# Patient Record
Sex: Male | Born: 1935 | Race: White | Hispanic: No | Marital: Married | Smoking: Smoker, current status unknown
Health system: Southern US, Community
[De-identification: ages and names within clinical notes are randomized; demographics above are authoritative.]

## PROBLEM LIST (undated history)

## (undated) DIAGNOSIS — G3184 Mild cognitive impairment, so stated: Secondary | ICD-10-CM

## (undated) DIAGNOSIS — I639 Cerebral infarction, unspecified: Secondary | ICD-10-CM

## (undated) DIAGNOSIS — D500 Iron deficiency anemia secondary to blood loss (chronic): Secondary | ICD-10-CM

## (undated) DIAGNOSIS — K219 Gastro-esophageal reflux disease without esophagitis: Secondary | ICD-10-CM

## (undated) DIAGNOSIS — M1990 Unspecified osteoarthritis, unspecified site: Secondary | ICD-10-CM

## (undated) DIAGNOSIS — F419 Anxiety disorder, unspecified: Secondary | ICD-10-CM

## (undated) DIAGNOSIS — K5790 Diverticulosis of intestine, part unspecified, without perforation or abscess without bleeding: Secondary | ICD-10-CM

## (undated) DIAGNOSIS — J189 Pneumonia, unspecified organism: Secondary | ICD-10-CM

## (undated) DIAGNOSIS — C4490 Unspecified malignant neoplasm of skin, unspecified: Secondary | ICD-10-CM

## (undated) DIAGNOSIS — E785 Hyperlipidemia, unspecified: Secondary | ICD-10-CM

## (undated) DIAGNOSIS — G250 Essential tremor: Secondary | ICD-10-CM

## (undated) DIAGNOSIS — I38 Endocarditis, valve unspecified: Secondary | ICD-10-CM

## (undated) DIAGNOSIS — F32A Depression, unspecified: Secondary | ICD-10-CM

## (undated) DIAGNOSIS — F329 Major depressive disorder, single episode, unspecified: Secondary | ICD-10-CM

## (undated) DIAGNOSIS — I10 Essential (primary) hypertension: Secondary | ICD-10-CM

## (undated) DIAGNOSIS — G9341 Metabolic encephalopathy: Secondary | ICD-10-CM

## (undated) DIAGNOSIS — F319 Bipolar disorder, unspecified: Secondary | ICD-10-CM

## (undated) DIAGNOSIS — N400 Enlarged prostate without lower urinary tract symptoms: Secondary | ICD-10-CM

## (undated) DIAGNOSIS — K5731 Diverticulosis of large intestine without perforation or abscess with bleeding: Secondary | ICD-10-CM

## (undated) HISTORY — PX: OTHER SURGICAL HISTORY: SHX169

## (undated) HISTORY — PX: TRANSURETHRAL RESECTION OF PROSTATE: SHX73

## (undated) HISTORY — PX: COLONOSCOPY: SHX174

---

## 2013-04-09 DIAGNOSIS — J209 Acute bronchitis, unspecified: Secondary | ICD-10-CM | POA: Diagnosis not present

## 2013-05-03 DIAGNOSIS — H353190 Nonexudative age-related macular degeneration, unspecified eye, stage unspecified: Secondary | ICD-10-CM | POA: Diagnosis not present

## 2013-05-20 DIAGNOSIS — L570 Actinic keratosis: Secondary | ICD-10-CM | POA: Diagnosis not present

## 2013-05-27 DIAGNOSIS — J069 Acute upper respiratory infection, unspecified: Secondary | ICD-10-CM | POA: Diagnosis not present

## 2013-06-04 DIAGNOSIS — L570 Actinic keratosis: Secondary | ICD-10-CM | POA: Diagnosis not present

## 2013-08-16 DIAGNOSIS — N183 Chronic kidney disease, stage 3 unspecified: Secondary | ICD-10-CM | POA: Diagnosis not present

## 2013-08-16 DIAGNOSIS — Z79899 Other long term (current) drug therapy: Secondary | ICD-10-CM | POA: Diagnosis not present

## 2013-08-16 DIAGNOSIS — F339 Major depressive disorder, recurrent, unspecified: Secondary | ICD-10-CM | POA: Diagnosis not present

## 2013-08-16 DIAGNOSIS — G4700 Insomnia, unspecified: Secondary | ICD-10-CM | POA: Diagnosis not present

## 2013-08-16 DIAGNOSIS — I10 Essential (primary) hypertension: Secondary | ICD-10-CM | POA: Diagnosis not present

## 2013-08-16 DIAGNOSIS — E785 Hyperlipidemia, unspecified: Secondary | ICD-10-CM | POA: Diagnosis not present

## 2013-09-27 DIAGNOSIS — H6120 Impacted cerumen, unspecified ear: Secondary | ICD-10-CM | POA: Diagnosis not present

## 2013-10-07 DIAGNOSIS — G4700 Insomnia, unspecified: Secondary | ICD-10-CM | POA: Diagnosis not present

## 2013-10-07 DIAGNOSIS — M25559 Pain in unspecified hip: Secondary | ICD-10-CM | POA: Diagnosis not present

## 2013-10-07 DIAGNOSIS — M1990 Unspecified osteoarthritis, unspecified site: Secondary | ICD-10-CM | POA: Diagnosis not present

## 2013-10-07 DIAGNOSIS — J984 Other disorders of lung: Secondary | ICD-10-CM | POA: Diagnosis not present

## 2013-10-07 DIAGNOSIS — I10 Essential (primary) hypertension: Secondary | ICD-10-CM | POA: Diagnosis not present

## 2013-10-12 DIAGNOSIS — L570 Actinic keratosis: Secondary | ICD-10-CM | POA: Diagnosis not present

## 2013-10-21 DIAGNOSIS — J209 Acute bronchitis, unspecified: Secondary | ICD-10-CM | POA: Diagnosis not present

## 2013-10-21 DIAGNOSIS — R7309 Other abnormal glucose: Secondary | ICD-10-CM | POA: Diagnosis not present

## 2013-10-21 DIAGNOSIS — M1990 Unspecified osteoarthritis, unspecified site: Secondary | ICD-10-CM | POA: Diagnosis not present

## 2013-11-02 DIAGNOSIS — Z79899 Other long term (current) drug therapy: Secondary | ICD-10-CM | POA: Diagnosis not present

## 2013-11-02 DIAGNOSIS — J209 Acute bronchitis, unspecified: Secondary | ICD-10-CM | POA: Diagnosis not present

## 2013-11-02 DIAGNOSIS — N183 Chronic kidney disease, stage 3 unspecified: Secondary | ICD-10-CM | POA: Diagnosis not present

## 2013-11-02 DIAGNOSIS — I10 Essential (primary) hypertension: Secondary | ICD-10-CM | POA: Diagnosis not present

## 2013-11-11 DIAGNOSIS — I10 Essential (primary) hypertension: Secondary | ICD-10-CM | POA: Diagnosis not present

## 2013-11-11 DIAGNOSIS — J209 Acute bronchitis, unspecified: Secondary | ICD-10-CM | POA: Diagnosis not present

## 2013-11-11 DIAGNOSIS — N183 Chronic kidney disease, stage 3 unspecified: Secondary | ICD-10-CM | POA: Diagnosis not present

## 2013-11-18 DIAGNOSIS — R059 Cough, unspecified: Secondary | ICD-10-CM | POA: Diagnosis not present

## 2013-11-18 DIAGNOSIS — R05 Cough: Secondary | ICD-10-CM | POA: Diagnosis not present

## 2013-11-18 DIAGNOSIS — J984 Other disorders of lung: Secondary | ICD-10-CM | POA: Diagnosis not present

## 2013-11-18 DIAGNOSIS — N183 Chronic kidney disease, stage 3 unspecified: Secondary | ICD-10-CM | POA: Diagnosis not present

## 2013-11-18 DIAGNOSIS — R350 Frequency of micturition: Secondary | ICD-10-CM | POA: Diagnosis not present

## 2013-11-22 DIAGNOSIS — N183 Chronic kidney disease, stage 3 unspecified: Secondary | ICD-10-CM | POA: Diagnosis not present

## 2013-11-22 DIAGNOSIS — Z8709 Personal history of other diseases of the respiratory system: Secondary | ICD-10-CM | POA: Diagnosis not present

## 2013-11-22 DIAGNOSIS — I10 Essential (primary) hypertension: Secondary | ICD-10-CM | POA: Diagnosis not present

## 2013-11-22 DIAGNOSIS — Z79899 Other long term (current) drug therapy: Secondary | ICD-10-CM | POA: Diagnosis not present

## 2013-12-21 DIAGNOSIS — Z23 Encounter for immunization: Secondary | ICD-10-CM | POA: Diagnosis not present

## 2013-12-21 DIAGNOSIS — Z79899 Other long term (current) drug therapy: Secondary | ICD-10-CM | POA: Diagnosis not present

## 2013-12-21 DIAGNOSIS — N183 Chronic kidney disease, stage 3 unspecified: Secondary | ICD-10-CM | POA: Diagnosis not present

## 2014-03-08 DIAGNOSIS — S79912A Unspecified injury of left hip, initial encounter: Secondary | ICD-10-CM | POA: Diagnosis not present

## 2014-03-08 DIAGNOSIS — M1611 Unilateral primary osteoarthritis, right hip: Secondary | ICD-10-CM | POA: Diagnosis not present

## 2014-03-08 DIAGNOSIS — S72011A Unspecified intracapsular fracture of right femur, initial encounter for closed fracture: Secondary | ICD-10-CM | POA: Diagnosis not present

## 2014-03-08 DIAGNOSIS — G8918 Other acute postprocedural pain: Secondary | ICD-10-CM | POA: Diagnosis not present

## 2014-03-08 DIAGNOSIS — F0390 Unspecified dementia without behavioral disturbance: Secondary | ICD-10-CM | POA: Diagnosis not present

## 2014-03-08 DIAGNOSIS — Z85828 Personal history of other malignant neoplasm of skin: Secondary | ICD-10-CM | POA: Diagnosis not present

## 2014-03-08 DIAGNOSIS — Z96641 Presence of right artificial hip joint: Secondary | ICD-10-CM | POA: Diagnosis not present

## 2014-03-08 DIAGNOSIS — W19XXXA Unspecified fall, initial encounter: Secondary | ICD-10-CM | POA: Diagnosis not present

## 2014-03-08 DIAGNOSIS — M25551 Pain in right hip: Secondary | ICD-10-CM | POA: Diagnosis not present

## 2014-03-08 DIAGNOSIS — S299XXA Unspecified injury of thorax, initial encounter: Secondary | ICD-10-CM | POA: Diagnosis not present

## 2014-03-08 DIAGNOSIS — M21259 Flexion deformity, unspecified hip: Secondary | ICD-10-CM | POA: Diagnosis not present

## 2014-03-08 DIAGNOSIS — W19XXXD Unspecified fall, subsequent encounter: Secondary | ICD-10-CM | POA: Diagnosis not present

## 2014-03-08 DIAGNOSIS — M25559 Pain in unspecified hip: Secondary | ICD-10-CM | POA: Diagnosis not present

## 2014-03-08 DIAGNOSIS — R531 Weakness: Secondary | ICD-10-CM | POA: Diagnosis not present

## 2014-03-08 DIAGNOSIS — F419 Anxiety disorder, unspecified: Secondary | ICD-10-CM | POA: Diagnosis present

## 2014-03-08 DIAGNOSIS — S79921A Unspecified injury of right thigh, initial encounter: Secondary | ICD-10-CM | POA: Diagnosis not present

## 2014-03-08 DIAGNOSIS — F329 Major depressive disorder, single episode, unspecified: Secondary | ICD-10-CM | POA: Diagnosis present

## 2014-03-08 DIAGNOSIS — I10 Essential (primary) hypertension: Secondary | ICD-10-CM | POA: Diagnosis not present

## 2014-03-08 DIAGNOSIS — S72001A Fracture of unspecified part of neck of right femur, initial encounter for closed fracture: Secondary | ICD-10-CM | POA: Diagnosis not present

## 2014-03-08 DIAGNOSIS — Z471 Aftercare following joint replacement surgery: Secondary | ICD-10-CM | POA: Diagnosis not present

## 2014-03-08 DIAGNOSIS — E860 Dehydration: Secondary | ICD-10-CM | POA: Diagnosis not present

## 2014-03-08 DIAGNOSIS — T699XXA Effect of reduced temperature, unspecified, initial encounter: Secondary | ICD-10-CM | POA: Diagnosis not present

## 2014-03-08 DIAGNOSIS — S72011D Unspecified intracapsular fracture of right femur, subsequent encounter for closed fracture with routine healing: Secondary | ICD-10-CM | POA: Diagnosis not present

## 2014-03-08 DIAGNOSIS — M79651 Pain in right thigh: Secondary | ICD-10-CM | POA: Diagnosis not present

## 2014-03-08 DIAGNOSIS — E785 Hyperlipidemia, unspecified: Secondary | ICD-10-CM | POA: Diagnosis not present

## 2014-03-09 DIAGNOSIS — Z96641 Presence of right artificial hip joint: Secondary | ICD-10-CM | POA: Diagnosis not present

## 2014-03-09 DIAGNOSIS — W19XXXA Unspecified fall, initial encounter: Secondary | ICD-10-CM | POA: Diagnosis not present

## 2014-03-09 DIAGNOSIS — E860 Dehydration: Secondary | ICD-10-CM | POA: Diagnosis not present

## 2014-03-09 DIAGNOSIS — E785 Hyperlipidemia, unspecified: Secondary | ICD-10-CM | POA: Diagnosis not present

## 2014-03-09 DIAGNOSIS — S72011A Unspecified intracapsular fracture of right femur, initial encounter for closed fracture: Secondary | ICD-10-CM | POA: Diagnosis not present

## 2014-03-09 DIAGNOSIS — Z471 Aftercare following joint replacement surgery: Secondary | ICD-10-CM | POA: Diagnosis not present

## 2014-03-09 DIAGNOSIS — M1611 Unilateral primary osteoarthritis, right hip: Secondary | ICD-10-CM | POA: Diagnosis not present

## 2014-03-09 DIAGNOSIS — F0390 Unspecified dementia without behavioral disturbance: Secondary | ICD-10-CM | POA: Diagnosis not present

## 2014-03-10 DIAGNOSIS — W19XXXA Unspecified fall, initial encounter: Secondary | ICD-10-CM | POA: Diagnosis not present

## 2014-03-10 DIAGNOSIS — E860 Dehydration: Secondary | ICD-10-CM | POA: Diagnosis not present

## 2014-03-10 DIAGNOSIS — S72011A Unspecified intracapsular fracture of right femur, initial encounter for closed fracture: Secondary | ICD-10-CM | POA: Diagnosis not present

## 2014-03-10 DIAGNOSIS — E785 Hyperlipidemia, unspecified: Secondary | ICD-10-CM | POA: Diagnosis not present

## 2014-03-11 DIAGNOSIS — M21259 Flexion deformity, unspecified hip: Secondary | ICD-10-CM | POA: Diagnosis not present

## 2014-03-11 DIAGNOSIS — F310 Bipolar disorder, current episode hypomanic: Secondary | ICD-10-CM | POA: Diagnosis not present

## 2014-03-11 DIAGNOSIS — E785 Hyperlipidemia, unspecified: Secondary | ICD-10-CM | POA: Diagnosis not present

## 2014-03-11 DIAGNOSIS — S72011A Unspecified intracapsular fracture of right femur, initial encounter for closed fracture: Secondary | ICD-10-CM | POA: Diagnosis not present

## 2014-03-11 DIAGNOSIS — Z96649 Presence of unspecified artificial hip joint: Secondary | ICD-10-CM | POA: Diagnosis not present

## 2014-03-11 DIAGNOSIS — R5381 Other malaise: Secondary | ICD-10-CM | POA: Diagnosis not present

## 2014-03-11 DIAGNOSIS — E860 Dehydration: Secondary | ICD-10-CM | POA: Diagnosis not present

## 2014-03-11 DIAGNOSIS — W19XXXA Unspecified fall, initial encounter: Secondary | ICD-10-CM | POA: Diagnosis not present

## 2014-03-11 DIAGNOSIS — R339 Retention of urine, unspecified: Secondary | ICD-10-CM | POA: Diagnosis not present

## 2014-03-11 DIAGNOSIS — W19XXXD Unspecified fall, subsequent encounter: Secondary | ICD-10-CM | POA: Diagnosis not present

## 2014-03-11 DIAGNOSIS — M25559 Pain in unspecified hip: Secondary | ICD-10-CM | POA: Diagnosis not present

## 2014-03-11 DIAGNOSIS — F0390 Unspecified dementia without behavioral disturbance: Secondary | ICD-10-CM | POA: Diagnosis not present

## 2014-03-11 DIAGNOSIS — D649 Anemia, unspecified: Secondary | ICD-10-CM | POA: Diagnosis not present

## 2014-03-11 DIAGNOSIS — S72011D Unspecified intracapsular fracture of right femur, subsequent encounter for closed fracture with routine healing: Secondary | ICD-10-CM | POA: Diagnosis not present

## 2014-03-11 DIAGNOSIS — G8918 Other acute postprocedural pain: Secondary | ICD-10-CM | POA: Diagnosis not present

## 2014-03-11 DIAGNOSIS — I119 Hypertensive heart disease without heart failure: Secondary | ICD-10-CM | POA: Diagnosis not present

## 2014-03-11 DIAGNOSIS — J159 Unspecified bacterial pneumonia: Secondary | ICD-10-CM | POA: Diagnosis not present

## 2014-03-14 DIAGNOSIS — S72011D Unspecified intracapsular fracture of right femur, subsequent encounter for closed fracture with routine healing: Secondary | ICD-10-CM | POA: Diagnosis not present

## 2014-03-14 DIAGNOSIS — J159 Unspecified bacterial pneumonia: Secondary | ICD-10-CM | POA: Diagnosis not present

## 2014-03-14 DIAGNOSIS — D649 Anemia, unspecified: Secondary | ICD-10-CM | POA: Diagnosis not present

## 2014-03-14 DIAGNOSIS — I119 Hypertensive heart disease without heart failure: Secondary | ICD-10-CM | POA: Diagnosis not present

## 2014-03-14 DIAGNOSIS — F310 Bipolar disorder, current episode hypomanic: Secondary | ICD-10-CM | POA: Diagnosis not present

## 2014-03-14 DIAGNOSIS — R5381 Other malaise: Secondary | ICD-10-CM | POA: Diagnosis not present

## 2014-03-14 DIAGNOSIS — G8918 Other acute postprocedural pain: Secondary | ICD-10-CM | POA: Diagnosis not present

## 2014-03-14 DIAGNOSIS — R339 Retention of urine, unspecified: Secondary | ICD-10-CM | POA: Diagnosis not present

## 2014-03-23 DIAGNOSIS — Z96649 Presence of unspecified artificial hip joint: Secondary | ICD-10-CM | POA: Diagnosis not present

## 2014-03-31 DIAGNOSIS — R0602 Shortness of breath: Secondary | ICD-10-CM | POA: Diagnosis not present

## 2014-03-31 DIAGNOSIS — Z7982 Long term (current) use of aspirin: Secondary | ICD-10-CM | POA: Diagnosis not present

## 2014-03-31 DIAGNOSIS — I10 Essential (primary) hypertension: Secondary | ICD-10-CM | POA: Diagnosis not present

## 2014-03-31 DIAGNOSIS — R51 Headache: Secondary | ICD-10-CM | POA: Diagnosis not present

## 2014-03-31 DIAGNOSIS — R918 Other nonspecific abnormal finding of lung field: Secondary | ICD-10-CM | POA: Diagnosis not present

## 2014-03-31 DIAGNOSIS — R55 Syncope and collapse: Secondary | ICD-10-CM | POA: Diagnosis not present

## 2014-03-31 DIAGNOSIS — W1839XA Other fall on same level, initial encounter: Secondary | ICD-10-CM | POA: Diagnosis not present

## 2014-03-31 DIAGNOSIS — R531 Weakness: Secondary | ICD-10-CM | POA: Diagnosis not present

## 2014-03-31 DIAGNOSIS — Z79899 Other long term (current) drug therapy: Secondary | ICD-10-CM | POA: Diagnosis not present

## 2014-03-31 DIAGNOSIS — M7989 Other specified soft tissue disorders: Secondary | ICD-10-CM | POA: Diagnosis not present

## 2014-03-31 DIAGNOSIS — R40242 Glasgow coma scale score 9-12: Secondary | ICD-10-CM | POA: Diagnosis not present

## 2014-03-31 DIAGNOSIS — R05 Cough: Secondary | ICD-10-CM | POA: Diagnosis not present

## 2014-03-31 DIAGNOSIS — R4182 Altered mental status, unspecified: Secondary | ICD-10-CM | POA: Diagnosis not present

## 2014-03-31 DIAGNOSIS — R9431 Abnormal electrocardiogram [ECG] [EKG]: Secondary | ICD-10-CM | POA: Diagnosis not present

## 2014-03-31 DIAGNOSIS — E780 Pure hypercholesterolemia: Secondary | ICD-10-CM | POA: Diagnosis not present

## 2014-03-31 DIAGNOSIS — R102 Pelvic and perineal pain: Secondary | ICD-10-CM | POA: Diagnosis not present

## 2014-03-31 DIAGNOSIS — G252 Other specified forms of tremor: Secondary | ICD-10-CM | POA: Diagnosis not present

## 2014-03-31 DIAGNOSIS — F0390 Unspecified dementia without behavioral disturbance: Secondary | ICD-10-CM | POA: Diagnosis not present

## 2014-03-31 DIAGNOSIS — M542 Cervicalgia: Secondary | ICD-10-CM | POA: Diagnosis not present

## 2014-03-31 DIAGNOSIS — J189 Pneumonia, unspecified organism: Secondary | ICD-10-CM | POA: Diagnosis not present

## 2014-03-31 DIAGNOSIS — R410 Disorientation, unspecified: Secondary | ICD-10-CM | POA: Diagnosis not present

## 2014-04-01 DIAGNOSIS — Z743 Need for continuous supervision: Secondary | ICD-10-CM | POA: Diagnosis not present

## 2014-04-01 DIAGNOSIS — M25551 Pain in right hip: Secondary | ICD-10-CM | POA: Diagnosis not present

## 2014-04-01 DIAGNOSIS — I10 Essential (primary) hypertension: Secondary | ICD-10-CM | POA: Diagnosis not present

## 2014-04-01 DIAGNOSIS — R51 Headache: Secondary | ICD-10-CM | POA: Diagnosis not present

## 2014-04-01 DIAGNOSIS — R279 Unspecified lack of coordination: Secondary | ICD-10-CM | POA: Diagnosis not present

## 2014-04-01 DIAGNOSIS — R05 Cough: Secondary | ICD-10-CM | POA: Diagnosis not present

## 2014-04-01 DIAGNOSIS — R4182 Altered mental status, unspecified: Secondary | ICD-10-CM | POA: Diagnosis not present

## 2014-04-01 DIAGNOSIS — M542 Cervicalgia: Secondary | ICD-10-CM | POA: Diagnosis not present

## 2014-04-01 DIAGNOSIS — R918 Other nonspecific abnormal finding of lung field: Secondary | ICD-10-CM | POA: Diagnosis not present

## 2014-04-01 DIAGNOSIS — R410 Disorientation, unspecified: Secondary | ICD-10-CM | POA: Diagnosis not present

## 2014-04-01 DIAGNOSIS — R0602 Shortness of breath: Secondary | ICD-10-CM | POA: Diagnosis not present

## 2014-04-01 DIAGNOSIS — R102 Pelvic and perineal pain: Secondary | ICD-10-CM | POA: Diagnosis not present

## 2014-04-01 DIAGNOSIS — J189 Pneumonia, unspecified organism: Secondary | ICD-10-CM | POA: Diagnosis not present

## 2014-04-02 DIAGNOSIS — R531 Weakness: Secondary | ICD-10-CM | POA: Diagnosis not present

## 2014-04-02 DIAGNOSIS — R9431 Abnormal electrocardiogram [ECG] [EKG]: Secondary | ICD-10-CM | POA: Diagnosis not present

## 2014-04-02 DIAGNOSIS — Z8701 Personal history of pneumonia (recurrent): Secondary | ICD-10-CM | POA: Diagnosis not present

## 2014-04-02 DIAGNOSIS — R4182 Altered mental status, unspecified: Secondary | ICD-10-CM | POA: Diagnosis not present

## 2014-04-02 DIAGNOSIS — R404 Transient alteration of awareness: Secondary | ICD-10-CM | POA: Diagnosis not present

## 2014-04-02 DIAGNOSIS — R55 Syncope and collapse: Secondary | ICD-10-CM | POA: Diagnosis not present

## 2014-04-02 DIAGNOSIS — E780 Pure hypercholesterolemia: Secondary | ICD-10-CM | POA: Diagnosis not present

## 2014-04-02 DIAGNOSIS — W1839XA Other fall on same level, initial encounter: Secondary | ICD-10-CM | POA: Diagnosis not present

## 2014-04-02 DIAGNOSIS — J189 Pneumonia, unspecified organism: Secondary | ICD-10-CM | POA: Diagnosis not present

## 2014-04-02 DIAGNOSIS — F0390 Unspecified dementia without behavioral disturbance: Secondary | ICD-10-CM | POA: Diagnosis not present

## 2014-04-02 DIAGNOSIS — I10 Essential (primary) hypertension: Secondary | ICD-10-CM | POA: Diagnosis not present

## 2014-04-02 DIAGNOSIS — M7989 Other specified soft tissue disorders: Secondary | ICD-10-CM | POA: Diagnosis not present

## 2014-04-04 DIAGNOSIS — W1839XA Other fall on same level, initial encounter: Secondary | ICD-10-CM | POA: Diagnosis not present

## 2014-04-04 DIAGNOSIS — S0081XA Abrasion of other part of head, initial encounter: Secondary | ICD-10-CM | POA: Diagnosis not present

## 2014-04-04 DIAGNOSIS — Y92129 Unspecified place in nursing home as the place of occurrence of the external cause: Secondary | ICD-10-CM | POA: Diagnosis not present

## 2014-04-04 DIAGNOSIS — R4182 Altered mental status, unspecified: Secondary | ICD-10-CM | POA: Diagnosis not present

## 2014-04-04 DIAGNOSIS — I10 Essential (primary) hypertension: Secondary | ICD-10-CM | POA: Diagnosis not present

## 2014-04-04 DIAGNOSIS — S0990XA Unspecified injury of head, initial encounter: Secondary | ICD-10-CM | POA: Diagnosis not present

## 2014-04-04 DIAGNOSIS — R259 Unspecified abnormal involuntary movements: Secondary | ICD-10-CM | POA: Diagnosis not present

## 2014-04-04 DIAGNOSIS — I6789 Other cerebrovascular disease: Secondary | ICD-10-CM | POA: Diagnosis not present

## 2014-04-04 DIAGNOSIS — R404 Transient alteration of awareness: Secondary | ICD-10-CM | POA: Diagnosis not present

## 2014-04-06 DIAGNOSIS — F310 Bipolar disorder, current episode hypomanic: Secondary | ICD-10-CM | POA: Diagnosis not present

## 2014-04-06 DIAGNOSIS — R0602 Shortness of breath: Secondary | ICD-10-CM | POA: Diagnosis not present

## 2014-04-06 DIAGNOSIS — J159 Unspecified bacterial pneumonia: Secondary | ICD-10-CM | POA: Diagnosis not present

## 2014-04-06 DIAGNOSIS — F0390 Unspecified dementia without behavioral disturbance: Secondary | ICD-10-CM | POA: Diagnosis not present

## 2014-04-06 DIAGNOSIS — R339 Retention of urine, unspecified: Secondary | ICD-10-CM | POA: Diagnosis present

## 2014-04-06 DIAGNOSIS — R609 Edema, unspecified: Secondary | ICD-10-CM | POA: Diagnosis not present

## 2014-04-06 DIAGNOSIS — R410 Disorientation, unspecified: Secondary | ICD-10-CM | POA: Diagnosis not present

## 2014-04-06 DIAGNOSIS — S9032XA Contusion of left foot, initial encounter: Secondary | ICD-10-CM | POA: Diagnosis present

## 2014-04-06 DIAGNOSIS — F319 Bipolar disorder, unspecified: Secondary | ICD-10-CM | POA: Diagnosis present

## 2014-04-06 DIAGNOSIS — E860 Dehydration: Secondary | ICD-10-CM | POA: Diagnosis not present

## 2014-04-06 DIAGNOSIS — J90 Pleural effusion, not elsewhere classified: Secondary | ICD-10-CM | POA: Diagnosis not present

## 2014-04-06 DIAGNOSIS — I10 Essential (primary) hypertension: Secondary | ICD-10-CM | POA: Diagnosis not present

## 2014-04-06 DIAGNOSIS — K219 Gastro-esophageal reflux disease without esophagitis: Secondary | ICD-10-CM | POA: Diagnosis present

## 2014-04-06 DIAGNOSIS — J189 Pneumonia, unspecified organism: Secondary | ICD-10-CM | POA: Diagnosis not present

## 2014-04-06 DIAGNOSIS — D649 Anemia, unspecified: Secondary | ICD-10-CM | POA: Diagnosis not present

## 2014-04-06 DIAGNOSIS — R5381 Other malaise: Secondary | ICD-10-CM | POA: Diagnosis present

## 2014-04-06 DIAGNOSIS — J9690 Respiratory failure, unspecified, unspecified whether with hypoxia or hypercapnia: Secondary | ICD-10-CM | POA: Diagnosis not present

## 2014-04-06 DIAGNOSIS — S9032XD Contusion of left foot, subsequent encounter: Secondary | ICD-10-CM | POA: Diagnosis not present

## 2014-04-06 DIAGNOSIS — G92 Toxic encephalopathy: Secondary | ICD-10-CM | POA: Diagnosis not present

## 2014-04-06 DIAGNOSIS — E785 Hyperlipidemia, unspecified: Secondary | ICD-10-CM | POA: Diagnosis not present

## 2014-04-12 DIAGNOSIS — R339 Retention of urine, unspecified: Secondary | ICD-10-CM | POA: Diagnosis not present

## 2014-04-12 DIAGNOSIS — S9032XA Contusion of left foot, initial encounter: Secondary | ICD-10-CM | POA: Diagnosis not present

## 2014-04-12 DIAGNOSIS — G8918 Other acute postprocedural pain: Secondary | ICD-10-CM | POA: Diagnosis not present

## 2014-04-12 DIAGNOSIS — I119 Hypertensive heart disease without heart failure: Secondary | ICD-10-CM | POA: Diagnosis not present

## 2014-04-12 DIAGNOSIS — R5381 Other malaise: Secondary | ICD-10-CM | POA: Diagnosis not present

## 2014-04-12 DIAGNOSIS — E785 Hyperlipidemia, unspecified: Secondary | ICD-10-CM | POA: Diagnosis not present

## 2014-04-12 DIAGNOSIS — S72011D Unspecified intracapsular fracture of right femur, subsequent encounter for closed fracture with routine healing: Secondary | ICD-10-CM | POA: Diagnosis not present

## 2014-04-12 DIAGNOSIS — D649 Anemia, unspecified: Secondary | ICD-10-CM | POA: Diagnosis not present

## 2014-04-12 DIAGNOSIS — J159 Unspecified bacterial pneumonia: Secondary | ICD-10-CM | POA: Diagnosis not present

## 2014-04-12 DIAGNOSIS — S9032XD Contusion of left foot, subsequent encounter: Secondary | ICD-10-CM | POA: Diagnosis not present

## 2014-04-12 DIAGNOSIS — F310 Bipolar disorder, current episode hypomanic: Secondary | ICD-10-CM | POA: Diagnosis not present

## 2014-04-12 DIAGNOSIS — G92 Toxic encephalopathy: Secondary | ICD-10-CM | POA: Diagnosis not present

## 2014-04-14 DIAGNOSIS — J159 Unspecified bacterial pneumonia: Secondary | ICD-10-CM | POA: Diagnosis not present

## 2014-04-14 DIAGNOSIS — S72011D Unspecified intracapsular fracture of right femur, subsequent encounter for closed fracture with routine healing: Secondary | ICD-10-CM | POA: Diagnosis not present

## 2014-04-14 DIAGNOSIS — G8918 Other acute postprocedural pain: Secondary | ICD-10-CM | POA: Diagnosis not present

## 2014-04-14 DIAGNOSIS — I119 Hypertensive heart disease without heart failure: Secondary | ICD-10-CM | POA: Diagnosis not present

## 2014-04-14 DIAGNOSIS — D649 Anemia, unspecified: Secondary | ICD-10-CM | POA: Diagnosis not present

## 2014-04-14 DIAGNOSIS — R5381 Other malaise: Secondary | ICD-10-CM | POA: Diagnosis not present

## 2014-04-14 DIAGNOSIS — R339 Retention of urine, unspecified: Secondary | ICD-10-CM | POA: Diagnosis not present

## 2014-04-14 DIAGNOSIS — F310 Bipolar disorder, current episode hypomanic: Secondary | ICD-10-CM | POA: Diagnosis not present

## 2014-04-26 DIAGNOSIS — M6281 Muscle weakness (generalized): Secondary | ICD-10-CM | POA: Diagnosis not present

## 2014-04-26 DIAGNOSIS — Z471 Aftercare following joint replacement surgery: Secondary | ICD-10-CM | POA: Diagnosis not present

## 2014-04-26 DIAGNOSIS — R2689 Other abnormalities of gait and mobility: Secondary | ICD-10-CM | POA: Diagnosis not present

## 2014-04-26 DIAGNOSIS — Z96641 Presence of right artificial hip joint: Secondary | ICD-10-CM | POA: Diagnosis not present

## 2014-04-27 DIAGNOSIS — I10 Essential (primary) hypertension: Secondary | ICD-10-CM | POA: Diagnosis not present

## 2014-04-27 DIAGNOSIS — F319 Bipolar disorder, unspecified: Secondary | ICD-10-CM | POA: Diagnosis not present

## 2014-04-27 DIAGNOSIS — N400 Enlarged prostate without lower urinary tract symptoms: Secondary | ICD-10-CM | POA: Diagnosis not present

## 2014-04-27 DIAGNOSIS — J189 Pneumonia, unspecified organism: Secondary | ICD-10-CM | POA: Diagnosis not present

## 2014-04-27 DIAGNOSIS — N183 Chronic kidney disease, stage 3 (moderate): Secondary | ICD-10-CM | POA: Diagnosis not present

## 2014-04-27 DIAGNOSIS — R410 Disorientation, unspecified: Secondary | ICD-10-CM | POA: Diagnosis not present

## 2014-04-28 DIAGNOSIS — Z471 Aftercare following joint replacement surgery: Secondary | ICD-10-CM | POA: Diagnosis not present

## 2014-04-28 DIAGNOSIS — M6281 Muscle weakness (generalized): Secondary | ICD-10-CM | POA: Diagnosis not present

## 2014-04-28 DIAGNOSIS — Z96641 Presence of right artificial hip joint: Secondary | ICD-10-CM | POA: Diagnosis not present

## 2014-04-28 DIAGNOSIS — R2689 Other abnormalities of gait and mobility: Secondary | ICD-10-CM | POA: Diagnosis not present

## 2014-05-02 DIAGNOSIS — M6281 Muscle weakness (generalized): Secondary | ICD-10-CM | POA: Diagnosis not present

## 2014-05-02 DIAGNOSIS — Z471 Aftercare following joint replacement surgery: Secondary | ICD-10-CM | POA: Diagnosis not present

## 2014-05-02 DIAGNOSIS — R2689 Other abnormalities of gait and mobility: Secondary | ICD-10-CM | POA: Diagnosis not present

## 2014-05-02 DIAGNOSIS — Z96641 Presence of right artificial hip joint: Secondary | ICD-10-CM | POA: Diagnosis not present

## 2014-05-04 DIAGNOSIS — Z96649 Presence of unspecified artificial hip joint: Secondary | ICD-10-CM | POA: Diagnosis not present

## 2014-05-05 DIAGNOSIS — Z96641 Presence of right artificial hip joint: Secondary | ICD-10-CM | POA: Diagnosis not present

## 2014-05-05 DIAGNOSIS — R2689 Other abnormalities of gait and mobility: Secondary | ICD-10-CM | POA: Diagnosis not present

## 2014-05-05 DIAGNOSIS — M6281 Muscle weakness (generalized): Secondary | ICD-10-CM | POA: Diagnosis not present

## 2014-05-05 DIAGNOSIS — Z471 Aftercare following joint replacement surgery: Secondary | ICD-10-CM | POA: Diagnosis not present

## 2014-05-09 DIAGNOSIS — M6281 Muscle weakness (generalized): Secondary | ICD-10-CM | POA: Diagnosis not present

## 2014-05-09 DIAGNOSIS — Z471 Aftercare following joint replacement surgery: Secondary | ICD-10-CM | POA: Diagnosis not present

## 2014-05-09 DIAGNOSIS — R2689 Other abnormalities of gait and mobility: Secondary | ICD-10-CM | POA: Diagnosis not present

## 2014-05-09 DIAGNOSIS — Z96641 Presence of right artificial hip joint: Secondary | ICD-10-CM | POA: Diagnosis not present

## 2014-05-10 DIAGNOSIS — R001 Bradycardia, unspecified: Secondary | ICD-10-CM | POA: Diagnosis not present

## 2014-05-10 DIAGNOSIS — R251 Tremor, unspecified: Secondary | ICD-10-CM | POA: Diagnosis not present

## 2014-05-10 DIAGNOSIS — Z79899 Other long term (current) drug therapy: Secondary | ICD-10-CM | POA: Diagnosis not present

## 2014-05-10 DIAGNOSIS — I10 Essential (primary) hypertension: Secondary | ICD-10-CM | POA: Diagnosis not present

## 2014-05-10 DIAGNOSIS — N390 Urinary tract infection, site not specified: Secondary | ICD-10-CM | POA: Diagnosis not present

## 2014-05-10 DIAGNOSIS — R350 Frequency of micturition: Secondary | ICD-10-CM | POA: Diagnosis not present

## 2014-05-12 DIAGNOSIS — M6281 Muscle weakness (generalized): Secondary | ICD-10-CM | POA: Diagnosis not present

## 2014-05-12 DIAGNOSIS — Z471 Aftercare following joint replacement surgery: Secondary | ICD-10-CM | POA: Diagnosis not present

## 2014-05-12 DIAGNOSIS — Z96641 Presence of right artificial hip joint: Secondary | ICD-10-CM | POA: Diagnosis not present

## 2014-05-12 DIAGNOSIS — R2689 Other abnormalities of gait and mobility: Secondary | ICD-10-CM | POA: Diagnosis not present

## 2014-05-16 DIAGNOSIS — R2689 Other abnormalities of gait and mobility: Secondary | ICD-10-CM | POA: Diagnosis not present

## 2014-05-16 DIAGNOSIS — Z471 Aftercare following joint replacement surgery: Secondary | ICD-10-CM | POA: Diagnosis not present

## 2014-05-16 DIAGNOSIS — M6281 Muscle weakness (generalized): Secondary | ICD-10-CM | POA: Diagnosis not present

## 2014-05-16 DIAGNOSIS — Z96641 Presence of right artificial hip joint: Secondary | ICD-10-CM | POA: Diagnosis not present

## 2014-05-18 DIAGNOSIS — M6281 Muscle weakness (generalized): Secondary | ICD-10-CM | POA: Diagnosis not present

## 2014-05-18 DIAGNOSIS — Z96641 Presence of right artificial hip joint: Secondary | ICD-10-CM | POA: Diagnosis not present

## 2014-05-18 DIAGNOSIS — Z471 Aftercare following joint replacement surgery: Secondary | ICD-10-CM | POA: Diagnosis not present

## 2014-05-18 DIAGNOSIS — R2689 Other abnormalities of gait and mobility: Secondary | ICD-10-CM | POA: Diagnosis not present

## 2014-05-19 DIAGNOSIS — S2232XA Fracture of one rib, left side, initial encounter for closed fracture: Secondary | ICD-10-CM | POA: Diagnosis not present

## 2014-05-19 DIAGNOSIS — K5641 Fecal impaction: Secondary | ICD-10-CM | POA: Diagnosis not present

## 2014-05-19 DIAGNOSIS — E780 Pure hypercholesterolemia: Secondary | ICD-10-CM | POA: Diagnosis not present

## 2014-05-19 DIAGNOSIS — R51 Headache: Secondary | ICD-10-CM | POA: Diagnosis not present

## 2014-05-19 DIAGNOSIS — Z87891 Personal history of nicotine dependence: Secondary | ICD-10-CM | POA: Diagnosis not present

## 2014-05-19 DIAGNOSIS — I2510 Atherosclerotic heart disease of native coronary artery without angina pectoris: Secondary | ICD-10-CM | POA: Diagnosis not present

## 2014-05-19 DIAGNOSIS — R55 Syncope and collapse: Secondary | ICD-10-CM | POA: Diagnosis not present

## 2014-05-19 DIAGNOSIS — M1990 Unspecified osteoarthritis, unspecified site: Secondary | ICD-10-CM | POA: Diagnosis not present

## 2014-05-19 DIAGNOSIS — S2239XA Fracture of one rib, unspecified side, initial encounter for closed fracture: Secondary | ICD-10-CM | POA: Diagnosis not present

## 2014-05-19 DIAGNOSIS — S4982XA Other specified injuries of left shoulder and upper arm, initial encounter: Secondary | ICD-10-CM | POA: Diagnosis not present

## 2014-05-19 DIAGNOSIS — I10 Essential (primary) hypertension: Secondary | ICD-10-CM | POA: Diagnosis not present

## 2014-05-19 DIAGNOSIS — S42342A Displaced spiral fracture of shaft of humerus, left arm, initial encounter for closed fracture: Secondary | ICD-10-CM | POA: Diagnosis not present

## 2014-05-19 DIAGNOSIS — R079 Chest pain, unspecified: Secondary | ICD-10-CM | POA: Diagnosis not present

## 2014-05-19 DIAGNOSIS — M79652 Pain in left thigh: Secondary | ICD-10-CM | POA: Diagnosis not present

## 2014-05-19 DIAGNOSIS — R52 Pain, unspecified: Secondary | ICD-10-CM | POA: Diagnosis not present

## 2014-05-19 DIAGNOSIS — Z7982 Long term (current) use of aspirin: Secondary | ICD-10-CM | POA: Diagnosis not present

## 2014-05-19 DIAGNOSIS — F0390 Unspecified dementia without behavioral disturbance: Secondary | ICD-10-CM | POA: Diagnosis not present

## 2014-05-19 DIAGNOSIS — N4289 Other specified disorders of prostate: Secondary | ICD-10-CM | POA: Diagnosis not present

## 2014-05-19 DIAGNOSIS — M25512 Pain in left shoulder: Secondary | ICD-10-CM | POA: Diagnosis not present

## 2014-05-19 DIAGNOSIS — S199XXA Unspecified injury of neck, initial encounter: Secondary | ICD-10-CM | POA: Diagnosis not present

## 2014-05-19 DIAGNOSIS — S0990XA Unspecified injury of head, initial encounter: Secondary | ICD-10-CM | POA: Diagnosis not present

## 2014-05-19 DIAGNOSIS — S79912A Unspecified injury of left hip, initial encounter: Secondary | ICD-10-CM | POA: Diagnosis not present

## 2014-05-19 DIAGNOSIS — R5381 Other malaise: Secondary | ICD-10-CM | POA: Diagnosis not present

## 2014-05-19 DIAGNOSIS — M810 Age-related osteoporosis without current pathological fracture: Secondary | ICD-10-CM | POA: Diagnosis not present

## 2014-05-19 DIAGNOSIS — Z792 Long term (current) use of antibiotics: Secondary | ICD-10-CM | POA: Diagnosis not present

## 2014-05-19 DIAGNOSIS — K5900 Constipation, unspecified: Secondary | ICD-10-CM | POA: Diagnosis not present

## 2014-05-19 DIAGNOSIS — S79911A Unspecified injury of right hip, initial encounter: Secondary | ICD-10-CM | POA: Diagnosis not present

## 2014-05-19 DIAGNOSIS — S42202A Unspecified fracture of upper end of left humerus, initial encounter for closed fracture: Secondary | ICD-10-CM | POA: Diagnosis not present

## 2014-05-19 DIAGNOSIS — S42202D Unspecified fracture of upper end of left humerus, subsequent encounter for fracture with routine healing: Secondary | ICD-10-CM | POA: Diagnosis not present

## 2014-05-20 DIAGNOSIS — S42202D Unspecified fracture of upper end of left humerus, subsequent encounter for fracture with routine healing: Secondary | ICD-10-CM | POA: Diagnosis not present

## 2014-05-20 DIAGNOSIS — K5900 Constipation, unspecified: Secondary | ICD-10-CM | POA: Diagnosis not present

## 2014-05-20 DIAGNOSIS — M810 Age-related osteoporosis without current pathological fracture: Secondary | ICD-10-CM | POA: Diagnosis not present

## 2014-05-20 DIAGNOSIS — R5381 Other malaise: Secondary | ICD-10-CM | POA: Diagnosis not present

## 2014-05-23 DIAGNOSIS — S42202A Unspecified fracture of upper end of left humerus, initial encounter for closed fracture: Secondary | ICD-10-CM | POA: Diagnosis not present

## 2014-05-30 DIAGNOSIS — S42202D Unspecified fracture of upper end of left humerus, subsequent encounter for fracture with routine healing: Secondary | ICD-10-CM | POA: Diagnosis not present

## 2014-05-30 DIAGNOSIS — M25512 Pain in left shoulder: Secondary | ICD-10-CM | POA: Diagnosis not present

## 2014-06-02 DIAGNOSIS — I10 Essential (primary) hypertension: Secondary | ICD-10-CM | POA: Diagnosis not present

## 2014-06-02 DIAGNOSIS — M25512 Pain in left shoulder: Secondary | ICD-10-CM | POA: Diagnosis not present

## 2014-06-02 DIAGNOSIS — S42202D Unspecified fracture of upper end of left humerus, subsequent encounter for fracture with routine healing: Secondary | ICD-10-CM | POA: Diagnosis not present

## 2014-06-02 DIAGNOSIS — F319 Bipolar disorder, unspecified: Secondary | ICD-10-CM | POA: Diagnosis not present

## 2014-06-03 DIAGNOSIS — F0390 Unspecified dementia without behavioral disturbance: Secondary | ICD-10-CM | POA: Diagnosis not present

## 2014-06-03 DIAGNOSIS — I10 Essential (primary) hypertension: Secondary | ICD-10-CM | POA: Diagnosis not present

## 2014-06-03 DIAGNOSIS — S42202D Unspecified fracture of upper end of left humerus, subsequent encounter for fracture with routine healing: Secondary | ICD-10-CM | POA: Diagnosis not present

## 2014-06-06 DIAGNOSIS — I10 Essential (primary) hypertension: Secondary | ICD-10-CM | POA: Diagnosis not present

## 2014-06-06 DIAGNOSIS — F0390 Unspecified dementia without behavioral disturbance: Secondary | ICD-10-CM | POA: Diagnosis not present

## 2014-06-06 DIAGNOSIS — S42202D Unspecified fracture of upper end of left humerus, subsequent encounter for fracture with routine healing: Secondary | ICD-10-CM | POA: Diagnosis not present

## 2014-06-08 DIAGNOSIS — S42202D Unspecified fracture of upper end of left humerus, subsequent encounter for fracture with routine healing: Secondary | ICD-10-CM | POA: Diagnosis not present

## 2014-06-08 DIAGNOSIS — I10 Essential (primary) hypertension: Secondary | ICD-10-CM | POA: Diagnosis not present

## 2014-06-08 DIAGNOSIS — F0390 Unspecified dementia without behavioral disturbance: Secondary | ICD-10-CM | POA: Diagnosis not present

## 2014-06-09 DIAGNOSIS — R197 Diarrhea, unspecified: Secondary | ICD-10-CM | POA: Diagnosis not present

## 2014-06-09 DIAGNOSIS — R112 Nausea with vomiting, unspecified: Secondary | ICD-10-CM | POA: Diagnosis not present

## 2014-06-09 DIAGNOSIS — F0390 Unspecified dementia without behavioral disturbance: Secondary | ICD-10-CM | POA: Diagnosis not present

## 2014-06-09 DIAGNOSIS — I10 Essential (primary) hypertension: Secondary | ICD-10-CM | POA: Diagnosis not present

## 2014-06-09 DIAGNOSIS — S42202D Unspecified fracture of upper end of left humerus, subsequent encounter for fracture with routine healing: Secondary | ICD-10-CM | POA: Diagnosis not present

## 2014-06-12 DIAGNOSIS — F0390 Unspecified dementia without behavioral disturbance: Secondary | ICD-10-CM | POA: Diagnosis not present

## 2014-06-12 DIAGNOSIS — I10 Essential (primary) hypertension: Secondary | ICD-10-CM | POA: Diagnosis not present

## 2014-06-12 DIAGNOSIS — S42202D Unspecified fracture of upper end of left humerus, subsequent encounter for fracture with routine healing: Secondary | ICD-10-CM | POA: Diagnosis not present

## 2014-06-13 DIAGNOSIS — F0390 Unspecified dementia without behavioral disturbance: Secondary | ICD-10-CM | POA: Diagnosis not present

## 2014-06-13 DIAGNOSIS — S42202D Unspecified fracture of upper end of left humerus, subsequent encounter for fracture with routine healing: Secondary | ICD-10-CM | POA: Diagnosis not present

## 2014-06-13 DIAGNOSIS — I10 Essential (primary) hypertension: Secondary | ICD-10-CM | POA: Diagnosis not present

## 2014-06-14 DIAGNOSIS — F0390 Unspecified dementia without behavioral disturbance: Secondary | ICD-10-CM | POA: Diagnosis not present

## 2014-06-14 DIAGNOSIS — S42202D Unspecified fracture of upper end of left humerus, subsequent encounter for fracture with routine healing: Secondary | ICD-10-CM | POA: Diagnosis not present

## 2014-06-14 DIAGNOSIS — I10 Essential (primary) hypertension: Secondary | ICD-10-CM | POA: Diagnosis not present

## 2014-06-16 DIAGNOSIS — F319 Bipolar disorder, unspecified: Secondary | ICD-10-CM | POA: Diagnosis not present

## 2014-06-16 DIAGNOSIS — W19XXXS Unspecified fall, sequela: Secondary | ICD-10-CM | POA: Diagnosis not present

## 2014-06-16 DIAGNOSIS — R4182 Altered mental status, unspecified: Secondary | ICD-10-CM | POA: Diagnosis not present

## 2014-06-16 DIAGNOSIS — S42202D Unspecified fracture of upper end of left humerus, subsequent encounter for fracture with routine healing: Secondary | ICD-10-CM | POA: Diagnosis not present

## 2014-06-16 DIAGNOSIS — R1030 Lower abdominal pain, unspecified: Secondary | ICD-10-CM | POA: Diagnosis not present

## 2014-06-16 DIAGNOSIS — F0390 Unspecified dementia without behavioral disturbance: Secondary | ICD-10-CM | POA: Diagnosis not present

## 2014-06-16 DIAGNOSIS — I10 Essential (primary) hypertension: Secondary | ICD-10-CM | POA: Diagnosis not present

## 2014-06-17 DIAGNOSIS — F0390 Unspecified dementia without behavioral disturbance: Secondary | ICD-10-CM | POA: Diagnosis not present

## 2014-06-17 DIAGNOSIS — I10 Essential (primary) hypertension: Secondary | ICD-10-CM | POA: Diagnosis not present

## 2014-06-17 DIAGNOSIS — S42202D Unspecified fracture of upper end of left humerus, subsequent encounter for fracture with routine healing: Secondary | ICD-10-CM | POA: Diagnosis not present

## 2014-06-20 DIAGNOSIS — I10 Essential (primary) hypertension: Secondary | ICD-10-CM | POA: Diagnosis not present

## 2014-06-20 DIAGNOSIS — F0390 Unspecified dementia without behavioral disturbance: Secondary | ICD-10-CM | POA: Diagnosis not present

## 2014-06-20 DIAGNOSIS — S42202D Unspecified fracture of upper end of left humerus, subsequent encounter for fracture with routine healing: Secondary | ICD-10-CM | POA: Diagnosis not present

## 2014-06-22 DIAGNOSIS — F0390 Unspecified dementia without behavioral disturbance: Secondary | ICD-10-CM | POA: Diagnosis not present

## 2014-06-22 DIAGNOSIS — I10 Essential (primary) hypertension: Secondary | ICD-10-CM | POA: Diagnosis not present

## 2014-06-22 DIAGNOSIS — N390 Urinary tract infection, site not specified: Secondary | ICD-10-CM | POA: Diagnosis not present

## 2014-06-22 DIAGNOSIS — S42202D Unspecified fracture of upper end of left humerus, subsequent encounter for fracture with routine healing: Secondary | ICD-10-CM | POA: Diagnosis not present

## 2014-06-22 DIAGNOSIS — Z79899 Other long term (current) drug therapy: Secondary | ICD-10-CM | POA: Diagnosis not present

## 2014-06-23 DIAGNOSIS — W19XXXS Unspecified fall, sequela: Secondary | ICD-10-CM | POA: Diagnosis not present

## 2014-06-23 DIAGNOSIS — D72829 Elevated white blood cell count, unspecified: Secondary | ICD-10-CM | POA: Diagnosis not present

## 2014-06-23 DIAGNOSIS — S42202D Unspecified fracture of upper end of left humerus, subsequent encounter for fracture with routine healing: Secondary | ICD-10-CM | POA: Diagnosis not present

## 2014-06-23 DIAGNOSIS — I10 Essential (primary) hypertension: Secondary | ICD-10-CM | POA: Diagnosis not present

## 2014-06-23 DIAGNOSIS — F0390 Unspecified dementia without behavioral disturbance: Secondary | ICD-10-CM | POA: Diagnosis not present

## 2014-06-23 DIAGNOSIS — N390 Urinary tract infection, site not specified: Secondary | ICD-10-CM | POA: Diagnosis not present

## 2014-06-23 DIAGNOSIS — R4182 Altered mental status, unspecified: Secondary | ICD-10-CM | POA: Diagnosis not present

## 2014-06-26 DIAGNOSIS — S42202D Unspecified fracture of upper end of left humerus, subsequent encounter for fracture with routine healing: Secondary | ICD-10-CM | POA: Diagnosis not present

## 2014-06-26 DIAGNOSIS — I10 Essential (primary) hypertension: Secondary | ICD-10-CM | POA: Diagnosis not present

## 2014-06-26 DIAGNOSIS — F0390 Unspecified dementia without behavioral disturbance: Secondary | ICD-10-CM | POA: Diagnosis not present

## 2014-06-27 DIAGNOSIS — S42202D Unspecified fracture of upper end of left humerus, subsequent encounter for fracture with routine healing: Secondary | ICD-10-CM | POA: Diagnosis not present

## 2014-06-27 DIAGNOSIS — I10 Essential (primary) hypertension: Secondary | ICD-10-CM | POA: Diagnosis not present

## 2014-06-27 DIAGNOSIS — F0390 Unspecified dementia without behavioral disturbance: Secondary | ICD-10-CM | POA: Diagnosis not present

## 2014-06-28 DIAGNOSIS — F419 Anxiety disorder, unspecified: Secondary | ICD-10-CM | POA: Diagnosis present

## 2014-06-28 DIAGNOSIS — G92 Toxic encephalopathy: Secondary | ICD-10-CM | POA: Diagnosis not present

## 2014-06-28 DIAGNOSIS — R40242 Glasgow coma scale score 9-12: Secondary | ICD-10-CM | POA: Diagnosis not present

## 2014-06-28 DIAGNOSIS — E780 Pure hypercholesterolemia: Secondary | ICD-10-CM | POA: Diagnosis present

## 2014-06-28 DIAGNOSIS — M1990 Unspecified osteoarthritis, unspecified site: Secondary | ICD-10-CM | POA: Diagnosis present

## 2014-06-28 DIAGNOSIS — N289 Disorder of kidney and ureter, unspecified: Secondary | ICD-10-CM | POA: Diagnosis present

## 2014-06-28 DIAGNOSIS — Z7982 Long term (current) use of aspirin: Secondary | ICD-10-CM | POA: Diagnosis not present

## 2014-06-28 DIAGNOSIS — I38 Endocarditis, valve unspecified: Secondary | ICD-10-CM | POA: Diagnosis present

## 2014-06-28 DIAGNOSIS — Z792 Long term (current) use of antibiotics: Secondary | ICD-10-CM | POA: Diagnosis not present

## 2014-06-28 DIAGNOSIS — Z79891 Long term (current) use of opiate analgesic: Secondary | ICD-10-CM | POA: Diagnosis not present

## 2014-06-28 DIAGNOSIS — D500 Iron deficiency anemia secondary to blood loss (chronic): Secondary | ICD-10-CM | POA: Diagnosis not present

## 2014-06-28 DIAGNOSIS — D62 Acute posthemorrhagic anemia: Secondary | ICD-10-CM | POA: Diagnosis present

## 2014-06-28 DIAGNOSIS — Z79899 Other long term (current) drug therapy: Secondary | ICD-10-CM | POA: Diagnosis not present

## 2014-06-28 DIAGNOSIS — Z9889 Other specified postprocedural states: Secondary | ICD-10-CM | POA: Diagnosis not present

## 2014-06-28 DIAGNOSIS — Z806 Family history of leukemia: Secondary | ICD-10-CM | POA: Diagnosis not present

## 2014-06-28 DIAGNOSIS — E785 Hyperlipidemia, unspecified: Secondary | ICD-10-CM | POA: Diagnosis not present

## 2014-06-28 DIAGNOSIS — C4490 Unspecified malignant neoplasm of skin, unspecified: Secondary | ICD-10-CM | POA: Diagnosis present

## 2014-06-28 DIAGNOSIS — K5731 Diverticulosis of large intestine without perforation or abscess with bleeding: Secondary | ICD-10-CM | POA: Diagnosis not present

## 2014-06-28 DIAGNOSIS — R5381 Other malaise: Secondary | ICD-10-CM | POA: Diagnosis not present

## 2014-06-28 DIAGNOSIS — R079 Chest pain, unspecified: Secondary | ICD-10-CM | POA: Diagnosis not present

## 2014-06-28 DIAGNOSIS — K219 Gastro-esophageal reflux disease without esophagitis: Secondary | ICD-10-CM | POA: Diagnosis present

## 2014-06-28 DIAGNOSIS — D510 Vitamin B12 deficiency anemia due to intrinsic factor deficiency: Secondary | ICD-10-CM | POA: Diagnosis not present

## 2014-06-28 DIAGNOSIS — G301 Alzheimer's disease with late onset: Secondary | ICD-10-CM | POA: Diagnosis not present

## 2014-06-28 DIAGNOSIS — F310 Bipolar disorder, current episode hypomanic: Secondary | ICD-10-CM | POA: Diagnosis not present

## 2014-06-28 DIAGNOSIS — N400 Enlarged prostate without lower urinary tract symptoms: Secondary | ICD-10-CM | POA: Diagnosis present

## 2014-06-28 DIAGNOSIS — F0280 Dementia in other diseases classified elsewhere without behavioral disturbance: Secondary | ICD-10-CM | POA: Diagnosis present

## 2014-06-28 DIAGNOSIS — I10 Essential (primary) hypertension: Secondary | ICD-10-CM | POA: Diagnosis present

## 2014-06-28 DIAGNOSIS — Z8673 Personal history of transient ischemic attack (TIA), and cerebral infarction without residual deficits: Secondary | ICD-10-CM | POA: Diagnosis not present

## 2014-06-28 DIAGNOSIS — D649 Anemia, unspecified: Secondary | ICD-10-CM | POA: Diagnosis not present

## 2014-06-28 DIAGNOSIS — Z87311 Personal history of (healed) other pathological fracture: Secondary | ICD-10-CM | POA: Diagnosis not present

## 2014-06-28 DIAGNOSIS — R251 Tremor, unspecified: Secondary | ICD-10-CM | POA: Diagnosis present

## 2014-06-28 DIAGNOSIS — K922 Gastrointestinal hemorrhage, unspecified: Secondary | ICD-10-CM | POA: Diagnosis not present

## 2014-07-07 DIAGNOSIS — Z96649 Presence of unspecified artificial hip joint: Secondary | ICD-10-CM | POA: Diagnosis not present

## 2014-07-07 DIAGNOSIS — M80012S Age-related osteoporosis with current pathological fracture, left shoulder, sequela: Secondary | ICD-10-CM | POA: Diagnosis not present

## 2014-07-07 DIAGNOSIS — S42202A Unspecified fracture of upper end of left humerus, initial encounter for closed fracture: Secondary | ICD-10-CM | POA: Diagnosis not present

## 2014-07-07 DIAGNOSIS — S42202D Unspecified fracture of upper end of left humerus, subsequent encounter for fracture with routine healing: Secondary | ICD-10-CM | POA: Diagnosis not present

## 2014-07-07 DIAGNOSIS — D500 Iron deficiency anemia secondary to blood loss (chronic): Secondary | ICD-10-CM | POA: Diagnosis not present

## 2014-07-07 DIAGNOSIS — K922 Gastrointestinal hemorrhage, unspecified: Secondary | ICD-10-CM | POA: Diagnosis not present

## 2014-07-10 DIAGNOSIS — I10 Essential (primary) hypertension: Secondary | ICD-10-CM | POA: Diagnosis not present

## 2014-07-10 DIAGNOSIS — S42202D Unspecified fracture of upper end of left humerus, subsequent encounter for fracture with routine healing: Secondary | ICD-10-CM | POA: Diagnosis not present

## 2014-07-10 DIAGNOSIS — F0390 Unspecified dementia without behavioral disturbance: Secondary | ICD-10-CM | POA: Diagnosis not present

## 2014-07-11 DIAGNOSIS — F0390 Unspecified dementia without behavioral disturbance: Secondary | ICD-10-CM | POA: Diagnosis not present

## 2014-07-11 DIAGNOSIS — S42202D Unspecified fracture of upper end of left humerus, subsequent encounter for fracture with routine healing: Secondary | ICD-10-CM | POA: Diagnosis not present

## 2014-07-11 DIAGNOSIS — I10 Essential (primary) hypertension: Secondary | ICD-10-CM | POA: Diagnosis not present

## 2014-07-12 DIAGNOSIS — I10 Essential (primary) hypertension: Secondary | ICD-10-CM | POA: Diagnosis not present

## 2014-07-12 DIAGNOSIS — S42202D Unspecified fracture of upper end of left humerus, subsequent encounter for fracture with routine healing: Secondary | ICD-10-CM | POA: Diagnosis not present

## 2014-07-12 DIAGNOSIS — F0390 Unspecified dementia without behavioral disturbance: Secondary | ICD-10-CM | POA: Diagnosis not present

## 2014-07-13 DIAGNOSIS — F0390 Unspecified dementia without behavioral disturbance: Secondary | ICD-10-CM | POA: Diagnosis not present

## 2014-07-13 DIAGNOSIS — I10 Essential (primary) hypertension: Secondary | ICD-10-CM | POA: Diagnosis not present

## 2014-07-13 DIAGNOSIS — S42202D Unspecified fracture of upper end of left humerus, subsequent encounter for fracture with routine healing: Secondary | ICD-10-CM | POA: Diagnosis not present

## 2014-07-14 DIAGNOSIS — R54 Age-related physical debility: Secondary | ICD-10-CM | POA: Diagnosis not present

## 2014-07-14 DIAGNOSIS — R600 Localized edema: Secondary | ICD-10-CM | POA: Diagnosis not present

## 2014-07-14 DIAGNOSIS — S42202D Unspecified fracture of upper end of left humerus, subsequent encounter for fracture with routine healing: Secondary | ICD-10-CM | POA: Diagnosis not present

## 2014-07-14 DIAGNOSIS — I10 Essential (primary) hypertension: Secondary | ICD-10-CM | POA: Diagnosis not present

## 2014-07-14 DIAGNOSIS — F0390 Unspecified dementia without behavioral disturbance: Secondary | ICD-10-CM | POA: Diagnosis not present

## 2014-07-16 DIAGNOSIS — C4442 Squamous cell carcinoma of skin of scalp and neck: Secondary | ICD-10-CM | POA: Diagnosis not present

## 2014-07-18 DIAGNOSIS — I10 Essential (primary) hypertension: Secondary | ICD-10-CM | POA: Diagnosis not present

## 2014-07-18 DIAGNOSIS — F0390 Unspecified dementia without behavioral disturbance: Secondary | ICD-10-CM | POA: Diagnosis not present

## 2014-07-18 DIAGNOSIS — S42202D Unspecified fracture of upper end of left humerus, subsequent encounter for fracture with routine healing: Secondary | ICD-10-CM | POA: Diagnosis not present

## 2014-07-20 DIAGNOSIS — S42202D Unspecified fracture of upper end of left humerus, subsequent encounter for fracture with routine healing: Secondary | ICD-10-CM | POA: Diagnosis not present

## 2014-07-20 DIAGNOSIS — F0390 Unspecified dementia without behavioral disturbance: Secondary | ICD-10-CM | POA: Diagnosis not present

## 2014-07-20 DIAGNOSIS — I10 Essential (primary) hypertension: Secondary | ICD-10-CM | POA: Diagnosis not present

## 2014-07-21 DIAGNOSIS — S42202D Unspecified fracture of upper end of left humerus, subsequent encounter for fracture with routine healing: Secondary | ICD-10-CM | POA: Diagnosis not present

## 2014-07-21 DIAGNOSIS — R54 Age-related physical debility: Secondary | ICD-10-CM | POA: Diagnosis not present

## 2014-07-21 DIAGNOSIS — I10 Essential (primary) hypertension: Secondary | ICD-10-CM | POA: Diagnosis not present

## 2014-07-21 DIAGNOSIS — W19XXXA Unspecified fall, initial encounter: Secondary | ICD-10-CM | POA: Diagnosis not present

## 2014-07-21 DIAGNOSIS — F0390 Unspecified dementia without behavioral disturbance: Secondary | ICD-10-CM | POA: Diagnosis not present

## 2014-07-22 DIAGNOSIS — I10 Essential (primary) hypertension: Secondary | ICD-10-CM | POA: Diagnosis not present

## 2014-07-22 DIAGNOSIS — F0390 Unspecified dementia without behavioral disturbance: Secondary | ICD-10-CM | POA: Diagnosis not present

## 2014-07-22 DIAGNOSIS — S42202D Unspecified fracture of upper end of left humerus, subsequent encounter for fracture with routine healing: Secondary | ICD-10-CM | POA: Diagnosis not present

## 2014-07-25 DIAGNOSIS — S42202D Unspecified fracture of upper end of left humerus, subsequent encounter for fracture with routine healing: Secondary | ICD-10-CM | POA: Diagnosis not present

## 2014-07-25 DIAGNOSIS — F0390 Unspecified dementia without behavioral disturbance: Secondary | ICD-10-CM | POA: Diagnosis not present

## 2014-07-25 DIAGNOSIS — I10 Essential (primary) hypertension: Secondary | ICD-10-CM | POA: Diagnosis not present

## 2014-07-26 DIAGNOSIS — I10 Essential (primary) hypertension: Secondary | ICD-10-CM | POA: Diagnosis not present

## 2014-07-26 DIAGNOSIS — F0390 Unspecified dementia without behavioral disturbance: Secondary | ICD-10-CM | POA: Diagnosis not present

## 2014-07-26 DIAGNOSIS — S42202D Unspecified fracture of upper end of left humerus, subsequent encounter for fracture with routine healing: Secondary | ICD-10-CM | POA: Diagnosis not present

## 2014-07-28 DIAGNOSIS — F0390 Unspecified dementia without behavioral disturbance: Secondary | ICD-10-CM | POA: Diagnosis not present

## 2014-07-28 DIAGNOSIS — I10 Essential (primary) hypertension: Secondary | ICD-10-CM | POA: Diagnosis not present

## 2014-07-28 DIAGNOSIS — S42202D Unspecified fracture of upper end of left humerus, subsequent encounter for fracture with routine healing: Secondary | ICD-10-CM | POA: Diagnosis not present

## 2014-08-01 DIAGNOSIS — S42202D Unspecified fracture of upper end of left humerus, subsequent encounter for fracture with routine healing: Secondary | ICD-10-CM | POA: Diagnosis not present

## 2014-08-01 DIAGNOSIS — F0390 Unspecified dementia without behavioral disturbance: Secondary | ICD-10-CM | POA: Diagnosis not present

## 2014-08-01 DIAGNOSIS — I10 Essential (primary) hypertension: Secondary | ICD-10-CM | POA: Diagnosis not present

## 2014-08-02 DIAGNOSIS — S42202D Unspecified fracture of upper end of left humerus, subsequent encounter for fracture with routine healing: Secondary | ICD-10-CM | POA: Diagnosis not present

## 2014-08-02 DIAGNOSIS — I10 Essential (primary) hypertension: Secondary | ICD-10-CM | POA: Diagnosis not present

## 2014-08-02 DIAGNOSIS — F0390 Unspecified dementia without behavioral disturbance: Secondary | ICD-10-CM | POA: Diagnosis not present

## 2014-08-03 DIAGNOSIS — S42202D Unspecified fracture of upper end of left humerus, subsequent encounter for fracture with routine healing: Secondary | ICD-10-CM | POA: Diagnosis not present

## 2014-08-04 DIAGNOSIS — F0390 Unspecified dementia without behavioral disturbance: Secondary | ICD-10-CM | POA: Diagnosis not present

## 2014-08-04 DIAGNOSIS — I10 Essential (primary) hypertension: Secondary | ICD-10-CM | POA: Diagnosis not present

## 2014-08-04 DIAGNOSIS — S42202D Unspecified fracture of upper end of left humerus, subsequent encounter for fracture with routine healing: Secondary | ICD-10-CM | POA: Diagnosis not present

## 2014-08-05 DIAGNOSIS — F0390 Unspecified dementia without behavioral disturbance: Secondary | ICD-10-CM | POA: Diagnosis not present

## 2014-08-05 DIAGNOSIS — I10 Essential (primary) hypertension: Secondary | ICD-10-CM | POA: Diagnosis not present

## 2014-08-05 DIAGNOSIS — S42202D Unspecified fracture of upper end of left humerus, subsequent encounter for fracture with routine healing: Secondary | ICD-10-CM | POA: Diagnosis not present

## 2014-08-08 DIAGNOSIS — S42202D Unspecified fracture of upper end of left humerus, subsequent encounter for fracture with routine healing: Secondary | ICD-10-CM | POA: Diagnosis not present

## 2014-08-08 DIAGNOSIS — F0390 Unspecified dementia without behavioral disturbance: Secondary | ICD-10-CM | POA: Diagnosis not present

## 2014-08-08 DIAGNOSIS — I10 Essential (primary) hypertension: Secondary | ICD-10-CM | POA: Diagnosis not present

## 2014-08-09 DIAGNOSIS — B351 Tinea unguium: Secondary | ICD-10-CM | POA: Diagnosis not present

## 2014-08-09 DIAGNOSIS — I10 Essential (primary) hypertension: Secondary | ICD-10-CM | POA: Diagnosis not present

## 2014-08-09 DIAGNOSIS — S42202D Unspecified fracture of upper end of left humerus, subsequent encounter for fracture with routine healing: Secondary | ICD-10-CM | POA: Diagnosis not present

## 2014-08-09 DIAGNOSIS — L609 Nail disorder, unspecified: Secondary | ICD-10-CM | POA: Diagnosis not present

## 2014-08-09 DIAGNOSIS — F0390 Unspecified dementia without behavioral disturbance: Secondary | ICD-10-CM | POA: Diagnosis not present

## 2014-08-11 DIAGNOSIS — F0390 Unspecified dementia without behavioral disturbance: Secondary | ICD-10-CM | POA: Diagnosis not present

## 2014-08-11 DIAGNOSIS — S42202D Unspecified fracture of upper end of left humerus, subsequent encounter for fracture with routine healing: Secondary | ICD-10-CM | POA: Diagnosis not present

## 2014-08-11 DIAGNOSIS — I10 Essential (primary) hypertension: Secondary | ICD-10-CM | POA: Diagnosis not present

## 2014-08-12 DIAGNOSIS — F0390 Unspecified dementia without behavioral disturbance: Secondary | ICD-10-CM | POA: Diagnosis not present

## 2014-08-12 DIAGNOSIS — I10 Essential (primary) hypertension: Secondary | ICD-10-CM | POA: Diagnosis not present

## 2014-08-12 DIAGNOSIS — S42202D Unspecified fracture of upper end of left humerus, subsequent encounter for fracture with routine healing: Secondary | ICD-10-CM | POA: Diagnosis not present

## 2014-08-15 DIAGNOSIS — I10 Essential (primary) hypertension: Secondary | ICD-10-CM | POA: Diagnosis not present

## 2014-08-15 DIAGNOSIS — F0390 Unspecified dementia without behavioral disturbance: Secondary | ICD-10-CM | POA: Diagnosis not present

## 2014-08-15 DIAGNOSIS — S42202D Unspecified fracture of upper end of left humerus, subsequent encounter for fracture with routine healing: Secondary | ICD-10-CM | POA: Diagnosis not present

## 2014-08-16 DIAGNOSIS — F0390 Unspecified dementia without behavioral disturbance: Secondary | ICD-10-CM | POA: Diagnosis not present

## 2014-08-16 DIAGNOSIS — S42202D Unspecified fracture of upper end of left humerus, subsequent encounter for fracture with routine healing: Secondary | ICD-10-CM | POA: Diagnosis not present

## 2014-08-16 DIAGNOSIS — I10 Essential (primary) hypertension: Secondary | ICD-10-CM | POA: Diagnosis not present

## 2014-08-17 DIAGNOSIS — M25561 Pain in right knee: Secondary | ICD-10-CM | POA: Diagnosis not present

## 2014-08-18 DIAGNOSIS — I10 Essential (primary) hypertension: Secondary | ICD-10-CM | POA: Diagnosis not present

## 2014-08-18 DIAGNOSIS — F0390 Unspecified dementia without behavioral disturbance: Secondary | ICD-10-CM | POA: Diagnosis not present

## 2014-08-18 DIAGNOSIS — S42202D Unspecified fracture of upper end of left humerus, subsequent encounter for fracture with routine healing: Secondary | ICD-10-CM | POA: Diagnosis not present

## 2014-08-19 DIAGNOSIS — I10 Essential (primary) hypertension: Secondary | ICD-10-CM | POA: Diagnosis not present

## 2014-08-19 DIAGNOSIS — F0390 Unspecified dementia without behavioral disturbance: Secondary | ICD-10-CM | POA: Diagnosis not present

## 2014-08-19 DIAGNOSIS — S42202D Unspecified fracture of upper end of left humerus, subsequent encounter for fracture with routine healing: Secondary | ICD-10-CM | POA: Diagnosis not present

## 2014-08-22 DIAGNOSIS — S42202D Unspecified fracture of upper end of left humerus, subsequent encounter for fracture with routine healing: Secondary | ICD-10-CM | POA: Diagnosis not present

## 2014-08-22 DIAGNOSIS — F0390 Unspecified dementia without behavioral disturbance: Secondary | ICD-10-CM | POA: Diagnosis not present

## 2014-08-22 DIAGNOSIS — I10 Essential (primary) hypertension: Secondary | ICD-10-CM | POA: Diagnosis not present

## 2014-08-23 DIAGNOSIS — S42202D Unspecified fracture of upper end of left humerus, subsequent encounter for fracture with routine healing: Secondary | ICD-10-CM | POA: Diagnosis not present

## 2014-08-23 DIAGNOSIS — I10 Essential (primary) hypertension: Secondary | ICD-10-CM | POA: Diagnosis not present

## 2014-08-23 DIAGNOSIS — F0390 Unspecified dementia without behavioral disturbance: Secondary | ICD-10-CM | POA: Diagnosis not present

## 2014-08-25 DIAGNOSIS — I10 Essential (primary) hypertension: Secondary | ICD-10-CM | POA: Diagnosis not present

## 2014-08-25 DIAGNOSIS — S42202D Unspecified fracture of upper end of left humerus, subsequent encounter for fracture with routine healing: Secondary | ICD-10-CM | POA: Diagnosis not present

## 2014-08-25 DIAGNOSIS — F0390 Unspecified dementia without behavioral disturbance: Secondary | ICD-10-CM | POA: Diagnosis not present

## 2014-08-29 DIAGNOSIS — S42202D Unspecified fracture of upper end of left humerus, subsequent encounter for fracture with routine healing: Secondary | ICD-10-CM | POA: Diagnosis not present

## 2014-08-29 DIAGNOSIS — F0390 Unspecified dementia without behavioral disturbance: Secondary | ICD-10-CM | POA: Diagnosis not present

## 2014-08-29 DIAGNOSIS — I10 Essential (primary) hypertension: Secondary | ICD-10-CM | POA: Diagnosis not present

## 2014-08-30 DIAGNOSIS — F0390 Unspecified dementia without behavioral disturbance: Secondary | ICD-10-CM | POA: Diagnosis not present

## 2014-08-30 DIAGNOSIS — S42202D Unspecified fracture of upper end of left humerus, subsequent encounter for fracture with routine healing: Secondary | ICD-10-CM | POA: Diagnosis not present

## 2014-08-30 DIAGNOSIS — I10 Essential (primary) hypertension: Secondary | ICD-10-CM | POA: Diagnosis not present

## 2014-08-31 DIAGNOSIS — S42202D Unspecified fracture of upper end of left humerus, subsequent encounter for fracture with routine healing: Secondary | ICD-10-CM | POA: Diagnosis not present

## 2014-09-01 DIAGNOSIS — F0390 Unspecified dementia without behavioral disturbance: Secondary | ICD-10-CM | POA: Diagnosis not present

## 2014-09-01 DIAGNOSIS — I10 Essential (primary) hypertension: Secondary | ICD-10-CM | POA: Diagnosis not present

## 2014-09-01 DIAGNOSIS — S42202D Unspecified fracture of upper end of left humerus, subsequent encounter for fracture with routine healing: Secondary | ICD-10-CM | POA: Diagnosis not present

## 2014-09-05 DIAGNOSIS — I10 Essential (primary) hypertension: Secondary | ICD-10-CM | POA: Diagnosis not present

## 2014-09-05 DIAGNOSIS — S42202D Unspecified fracture of upper end of left humerus, subsequent encounter for fracture with routine healing: Secondary | ICD-10-CM | POA: Diagnosis not present

## 2014-09-05 DIAGNOSIS — F0390 Unspecified dementia without behavioral disturbance: Secondary | ICD-10-CM | POA: Diagnosis not present

## 2014-09-06 DIAGNOSIS — I10 Essential (primary) hypertension: Secondary | ICD-10-CM | POA: Diagnosis not present

## 2014-09-06 DIAGNOSIS — S42202D Unspecified fracture of upper end of left humerus, subsequent encounter for fracture with routine healing: Secondary | ICD-10-CM | POA: Diagnosis not present

## 2014-09-06 DIAGNOSIS — F0390 Unspecified dementia without behavioral disturbance: Secondary | ICD-10-CM | POA: Diagnosis not present

## 2014-09-08 DIAGNOSIS — S42202D Unspecified fracture of upper end of left humerus, subsequent encounter for fracture with routine healing: Secondary | ICD-10-CM | POA: Diagnosis not present

## 2014-09-08 DIAGNOSIS — I10 Essential (primary) hypertension: Secondary | ICD-10-CM | POA: Diagnosis not present

## 2014-09-08 DIAGNOSIS — F0390 Unspecified dementia without behavioral disturbance: Secondary | ICD-10-CM | POA: Diagnosis not present

## 2014-09-09 DIAGNOSIS — F0390 Unspecified dementia without behavioral disturbance: Secondary | ICD-10-CM | POA: Diagnosis not present

## 2014-09-09 DIAGNOSIS — S42202D Unspecified fracture of upper end of left humerus, subsequent encounter for fracture with routine healing: Secondary | ICD-10-CM | POA: Diagnosis not present

## 2014-09-09 DIAGNOSIS — I10 Essential (primary) hypertension: Secondary | ICD-10-CM | POA: Diagnosis not present

## 2014-09-12 DIAGNOSIS — I10 Essential (primary) hypertension: Secondary | ICD-10-CM | POA: Diagnosis not present

## 2014-09-12 DIAGNOSIS — F0390 Unspecified dementia without behavioral disturbance: Secondary | ICD-10-CM | POA: Diagnosis not present

## 2014-09-12 DIAGNOSIS — S42202D Unspecified fracture of upper end of left humerus, subsequent encounter for fracture with routine healing: Secondary | ICD-10-CM | POA: Diagnosis not present

## 2014-09-14 DIAGNOSIS — S42202D Unspecified fracture of upper end of left humerus, subsequent encounter for fracture with routine healing: Secondary | ICD-10-CM | POA: Diagnosis not present

## 2014-09-14 DIAGNOSIS — I10 Essential (primary) hypertension: Secondary | ICD-10-CM | POA: Diagnosis not present

## 2014-09-14 DIAGNOSIS — F0390 Unspecified dementia without behavioral disturbance: Secondary | ICD-10-CM | POA: Diagnosis not present

## 2014-09-15 DIAGNOSIS — D649 Anemia, unspecified: Secondary | ICD-10-CM | POA: Diagnosis not present

## 2014-09-15 DIAGNOSIS — N429 Disorder of prostate, unspecified: Secondary | ICD-10-CM | POA: Diagnosis not present

## 2014-09-15 DIAGNOSIS — F3110 Bipolar disorder, current episode manic without psychotic features, unspecified: Secondary | ICD-10-CM | POA: Diagnosis not present

## 2014-09-15 DIAGNOSIS — I10 Essential (primary) hypertension: Secondary | ICD-10-CM | POA: Diagnosis not present

## 2014-09-15 DIAGNOSIS — I739 Peripheral vascular disease, unspecified: Secondary | ICD-10-CM | POA: Diagnosis not present

## 2014-09-15 DIAGNOSIS — M810 Age-related osteoporosis without current pathological fracture: Secondary | ICD-10-CM | POA: Diagnosis not present

## 2014-09-15 DIAGNOSIS — R627 Adult failure to thrive: Secondary | ICD-10-CM | POA: Diagnosis not present

## 2014-09-15 DIAGNOSIS — E785 Hyperlipidemia, unspecified: Secondary | ICD-10-CM | POA: Diagnosis not present

## 2014-09-15 DIAGNOSIS — F0390 Unspecified dementia without behavioral disturbance: Secondary | ICD-10-CM | POA: Diagnosis not present

## 2014-09-18 DIAGNOSIS — R9082 White matter disease, unspecified: Secondary | ICD-10-CM | POA: Diagnosis not present

## 2014-09-18 DIAGNOSIS — S0101XA Laceration without foreign body of scalp, initial encounter: Secondary | ICD-10-CM | POA: Diagnosis not present

## 2014-09-18 DIAGNOSIS — S199XXA Unspecified injury of neck, initial encounter: Secondary | ICD-10-CM | POA: Diagnosis not present

## 2014-09-18 DIAGNOSIS — S0191XA Laceration without foreign body of unspecified part of head, initial encounter: Secondary | ICD-10-CM | POA: Diagnosis not present

## 2014-09-18 DIAGNOSIS — S0003XA Contusion of scalp, initial encounter: Secondary | ICD-10-CM | POA: Diagnosis not present

## 2014-09-18 DIAGNOSIS — W1839XA Other fall on same level, initial encounter: Secondary | ICD-10-CM | POA: Diagnosis not present

## 2014-09-18 DIAGNOSIS — S0990XA Unspecified injury of head, initial encounter: Secondary | ICD-10-CM | POA: Diagnosis not present

## 2014-09-20 DIAGNOSIS — D649 Anemia, unspecified: Secondary | ICD-10-CM | POA: Diagnosis not present

## 2014-09-20 DIAGNOSIS — Z79899 Other long term (current) drug therapy: Secondary | ICD-10-CM | POA: Diagnosis not present

## 2014-09-20 DIAGNOSIS — I10 Essential (primary) hypertension: Secondary | ICD-10-CM | POA: Diagnosis not present

## 2014-09-22 DIAGNOSIS — W19XXXA Unspecified fall, initial encounter: Secondary | ICD-10-CM | POA: Diagnosis not present

## 2014-09-22 DIAGNOSIS — S0100XA Unspecified open wound of scalp, initial encounter: Secondary | ICD-10-CM | POA: Diagnosis not present

## 2014-09-26 DIAGNOSIS — F319 Bipolar disorder, unspecified: Secondary | ICD-10-CM | POA: Diagnosis not present

## 2014-09-26 DIAGNOSIS — F0390 Unspecified dementia without behavioral disturbance: Secondary | ICD-10-CM | POA: Diagnosis not present

## 2014-09-26 DIAGNOSIS — I10 Essential (primary) hypertension: Secondary | ICD-10-CM | POA: Diagnosis not present

## 2014-09-26 DIAGNOSIS — R2681 Unsteadiness on feet: Secondary | ICD-10-CM | POA: Diagnosis not present

## 2014-09-26 DIAGNOSIS — F329 Major depressive disorder, single episode, unspecified: Secondary | ICD-10-CM | POA: Diagnosis not present

## 2014-09-26 DIAGNOSIS — Z9181 History of falling: Secondary | ICD-10-CM | POA: Diagnosis not present

## 2014-09-29 DIAGNOSIS — F319 Bipolar disorder, unspecified: Secondary | ICD-10-CM | POA: Diagnosis not present

## 2014-09-29 DIAGNOSIS — F329 Major depressive disorder, single episode, unspecified: Secondary | ICD-10-CM | POA: Diagnosis not present

## 2014-09-29 DIAGNOSIS — F0390 Unspecified dementia without behavioral disturbance: Secondary | ICD-10-CM | POA: Diagnosis not present

## 2014-09-29 DIAGNOSIS — Z9181 History of falling: Secondary | ICD-10-CM | POA: Diagnosis not present

## 2014-09-29 DIAGNOSIS — R2681 Unsteadiness on feet: Secondary | ICD-10-CM | POA: Diagnosis not present

## 2014-09-29 DIAGNOSIS — I10 Essential (primary) hypertension: Secondary | ICD-10-CM | POA: Diagnosis not present

## 2014-09-30 DIAGNOSIS — I10 Essential (primary) hypertension: Secondary | ICD-10-CM | POA: Diagnosis not present

## 2014-09-30 DIAGNOSIS — F329 Major depressive disorder, single episode, unspecified: Secondary | ICD-10-CM | POA: Diagnosis not present

## 2014-09-30 DIAGNOSIS — F0390 Unspecified dementia without behavioral disturbance: Secondary | ICD-10-CM | POA: Diagnosis not present

## 2014-09-30 DIAGNOSIS — R2681 Unsteadiness on feet: Secondary | ICD-10-CM | POA: Diagnosis not present

## 2014-09-30 DIAGNOSIS — Z9181 History of falling: Secondary | ICD-10-CM | POA: Diagnosis not present

## 2014-09-30 DIAGNOSIS — F319 Bipolar disorder, unspecified: Secondary | ICD-10-CM | POA: Diagnosis not present

## 2014-10-04 DIAGNOSIS — I10 Essential (primary) hypertension: Secondary | ICD-10-CM | POA: Diagnosis not present

## 2014-10-04 DIAGNOSIS — F329 Major depressive disorder, single episode, unspecified: Secondary | ICD-10-CM | POA: Diagnosis not present

## 2014-10-04 DIAGNOSIS — F319 Bipolar disorder, unspecified: Secondary | ICD-10-CM | POA: Diagnosis not present

## 2014-10-04 DIAGNOSIS — Z9181 History of falling: Secondary | ICD-10-CM | POA: Diagnosis not present

## 2014-10-04 DIAGNOSIS — R2681 Unsteadiness on feet: Secondary | ICD-10-CM | POA: Diagnosis not present

## 2014-10-04 DIAGNOSIS — F0390 Unspecified dementia without behavioral disturbance: Secondary | ICD-10-CM | POA: Diagnosis not present

## 2014-10-06 DIAGNOSIS — F319 Bipolar disorder, unspecified: Secondary | ICD-10-CM | POA: Diagnosis not present

## 2014-10-06 DIAGNOSIS — F0390 Unspecified dementia without behavioral disturbance: Secondary | ICD-10-CM | POA: Diagnosis not present

## 2014-10-06 DIAGNOSIS — I10 Essential (primary) hypertension: Secondary | ICD-10-CM | POA: Diagnosis not present

## 2014-10-06 DIAGNOSIS — F329 Major depressive disorder, single episode, unspecified: Secondary | ICD-10-CM | POA: Diagnosis not present

## 2014-10-06 DIAGNOSIS — Z9181 History of falling: Secondary | ICD-10-CM | POA: Diagnosis not present

## 2014-10-06 DIAGNOSIS — R2681 Unsteadiness on feet: Secondary | ICD-10-CM | POA: Diagnosis not present

## 2014-10-07 DIAGNOSIS — I10 Essential (primary) hypertension: Secondary | ICD-10-CM | POA: Diagnosis not present

## 2014-10-07 DIAGNOSIS — F329 Major depressive disorder, single episode, unspecified: Secondary | ICD-10-CM | POA: Diagnosis not present

## 2014-10-07 DIAGNOSIS — F319 Bipolar disorder, unspecified: Secondary | ICD-10-CM | POA: Diagnosis not present

## 2014-10-07 DIAGNOSIS — Z9181 History of falling: Secondary | ICD-10-CM | POA: Diagnosis not present

## 2014-10-07 DIAGNOSIS — F0390 Unspecified dementia without behavioral disturbance: Secondary | ICD-10-CM | POA: Diagnosis not present

## 2014-10-07 DIAGNOSIS — R2681 Unsteadiness on feet: Secondary | ICD-10-CM | POA: Diagnosis not present

## 2014-10-10 DIAGNOSIS — F329 Major depressive disorder, single episode, unspecified: Secondary | ICD-10-CM | POA: Diagnosis not present

## 2014-10-10 DIAGNOSIS — R2681 Unsteadiness on feet: Secondary | ICD-10-CM | POA: Diagnosis not present

## 2014-10-10 DIAGNOSIS — F319 Bipolar disorder, unspecified: Secondary | ICD-10-CM | POA: Diagnosis not present

## 2014-10-10 DIAGNOSIS — Z9181 History of falling: Secondary | ICD-10-CM | POA: Diagnosis not present

## 2014-10-10 DIAGNOSIS — F0390 Unspecified dementia without behavioral disturbance: Secondary | ICD-10-CM | POA: Diagnosis not present

## 2014-10-10 DIAGNOSIS — I10 Essential (primary) hypertension: Secondary | ICD-10-CM | POA: Diagnosis not present

## 2014-10-12 DIAGNOSIS — S42202A Unspecified fracture of upper end of left humerus, initial encounter for closed fracture: Secondary | ICD-10-CM | POA: Diagnosis not present

## 2014-10-14 DIAGNOSIS — R2681 Unsteadiness on feet: Secondary | ICD-10-CM | POA: Diagnosis not present

## 2014-10-14 DIAGNOSIS — F0390 Unspecified dementia without behavioral disturbance: Secondary | ICD-10-CM | POA: Diagnosis not present

## 2014-10-14 DIAGNOSIS — F319 Bipolar disorder, unspecified: Secondary | ICD-10-CM | POA: Diagnosis not present

## 2014-10-14 DIAGNOSIS — I10 Essential (primary) hypertension: Secondary | ICD-10-CM | POA: Diagnosis not present

## 2014-10-14 DIAGNOSIS — Z9181 History of falling: Secondary | ICD-10-CM | POA: Diagnosis not present

## 2014-10-14 DIAGNOSIS — F329 Major depressive disorder, single episode, unspecified: Secondary | ICD-10-CM | POA: Diagnosis not present

## 2014-10-18 DIAGNOSIS — F319 Bipolar disorder, unspecified: Secondary | ICD-10-CM | POA: Diagnosis not present

## 2014-10-18 DIAGNOSIS — Z9181 History of falling: Secondary | ICD-10-CM | POA: Diagnosis not present

## 2014-10-18 DIAGNOSIS — I10 Essential (primary) hypertension: Secondary | ICD-10-CM | POA: Diagnosis not present

## 2014-10-18 DIAGNOSIS — R2681 Unsteadiness on feet: Secondary | ICD-10-CM | POA: Diagnosis not present

## 2014-10-18 DIAGNOSIS — F0390 Unspecified dementia without behavioral disturbance: Secondary | ICD-10-CM | POA: Diagnosis not present

## 2014-10-18 DIAGNOSIS — F329 Major depressive disorder, single episode, unspecified: Secondary | ICD-10-CM | POA: Diagnosis not present

## 2014-10-20 DIAGNOSIS — R2681 Unsteadiness on feet: Secondary | ICD-10-CM | POA: Diagnosis not present

## 2014-10-20 DIAGNOSIS — Z9181 History of falling: Secondary | ICD-10-CM | POA: Diagnosis not present

## 2014-10-20 DIAGNOSIS — F319 Bipolar disorder, unspecified: Secondary | ICD-10-CM | POA: Diagnosis not present

## 2014-10-20 DIAGNOSIS — F0390 Unspecified dementia without behavioral disturbance: Secondary | ICD-10-CM | POA: Diagnosis not present

## 2014-10-20 DIAGNOSIS — I10 Essential (primary) hypertension: Secondary | ICD-10-CM | POA: Diagnosis not present

## 2014-10-20 DIAGNOSIS — F329 Major depressive disorder, single episode, unspecified: Secondary | ICD-10-CM | POA: Diagnosis not present

## 2014-10-24 DIAGNOSIS — R2681 Unsteadiness on feet: Secondary | ICD-10-CM | POA: Diagnosis not present

## 2014-10-24 DIAGNOSIS — Z9181 History of falling: Secondary | ICD-10-CM | POA: Diagnosis not present

## 2014-10-24 DIAGNOSIS — F0390 Unspecified dementia without behavioral disturbance: Secondary | ICD-10-CM | POA: Diagnosis not present

## 2014-10-24 DIAGNOSIS — F319 Bipolar disorder, unspecified: Secondary | ICD-10-CM | POA: Diagnosis not present

## 2014-10-24 DIAGNOSIS — I10 Essential (primary) hypertension: Secondary | ICD-10-CM | POA: Diagnosis not present

## 2014-10-24 DIAGNOSIS — F329 Major depressive disorder, single episode, unspecified: Secondary | ICD-10-CM | POA: Diagnosis not present

## 2014-10-26 DIAGNOSIS — Z9181 History of falling: Secondary | ICD-10-CM | POA: Diagnosis not present

## 2014-10-26 DIAGNOSIS — F0390 Unspecified dementia without behavioral disturbance: Secondary | ICD-10-CM | POA: Diagnosis not present

## 2014-10-26 DIAGNOSIS — F329 Major depressive disorder, single episode, unspecified: Secondary | ICD-10-CM | POA: Diagnosis not present

## 2014-10-26 DIAGNOSIS — I10 Essential (primary) hypertension: Secondary | ICD-10-CM | POA: Diagnosis not present

## 2014-10-26 DIAGNOSIS — F319 Bipolar disorder, unspecified: Secondary | ICD-10-CM | POA: Diagnosis not present

## 2014-10-26 DIAGNOSIS — R2681 Unsteadiness on feet: Secondary | ICD-10-CM | POA: Diagnosis not present

## 2014-11-03 DIAGNOSIS — L989 Disorder of the skin and subcutaneous tissue, unspecified: Secondary | ICD-10-CM | POA: Diagnosis not present

## 2014-11-08 DIAGNOSIS — B351 Tinea unguium: Secondary | ICD-10-CM | POA: Diagnosis not present

## 2014-11-08 DIAGNOSIS — L609 Nail disorder, unspecified: Secondary | ICD-10-CM | POA: Diagnosis not present

## 2014-11-09 DIAGNOSIS — F0390 Unspecified dementia without behavioral disturbance: Secondary | ICD-10-CM | POA: Diagnosis not present

## 2014-11-09 DIAGNOSIS — F329 Major depressive disorder, single episode, unspecified: Secondary | ICD-10-CM | POA: Diagnosis not present

## 2014-11-09 DIAGNOSIS — R2681 Unsteadiness on feet: Secondary | ICD-10-CM | POA: Diagnosis not present

## 2014-11-09 DIAGNOSIS — F319 Bipolar disorder, unspecified: Secondary | ICD-10-CM | POA: Diagnosis not present

## 2014-11-09 DIAGNOSIS — I10 Essential (primary) hypertension: Secondary | ICD-10-CM | POA: Diagnosis not present

## 2014-11-09 DIAGNOSIS — Z9181 History of falling: Secondary | ICD-10-CM | POA: Diagnosis not present

## 2014-11-17 DIAGNOSIS — F0390 Unspecified dementia without behavioral disturbance: Secondary | ICD-10-CM | POA: Diagnosis not present

## 2014-11-17 DIAGNOSIS — F319 Bipolar disorder, unspecified: Secondary | ICD-10-CM | POA: Diagnosis not present

## 2014-11-17 DIAGNOSIS — Z9181 History of falling: Secondary | ICD-10-CM | POA: Diagnosis not present

## 2014-11-17 DIAGNOSIS — I10 Essential (primary) hypertension: Secondary | ICD-10-CM | POA: Diagnosis not present

## 2014-11-17 DIAGNOSIS — R251 Tremor, unspecified: Secondary | ICD-10-CM | POA: Diagnosis not present

## 2014-11-17 DIAGNOSIS — F329 Major depressive disorder, single episode, unspecified: Secondary | ICD-10-CM | POA: Diagnosis not present

## 2014-11-17 DIAGNOSIS — R2681 Unsteadiness on feet: Secondary | ICD-10-CM | POA: Diagnosis not present

## 2014-11-18 DIAGNOSIS — M25561 Pain in right knee: Secondary | ICD-10-CM | POA: Diagnosis not present

## 2014-11-22 DIAGNOSIS — Z9181 History of falling: Secondary | ICD-10-CM | POA: Diagnosis not present

## 2014-11-22 DIAGNOSIS — F319 Bipolar disorder, unspecified: Secondary | ICD-10-CM | POA: Diagnosis not present

## 2014-11-22 DIAGNOSIS — F329 Major depressive disorder, single episode, unspecified: Secondary | ICD-10-CM | POA: Diagnosis not present

## 2014-11-22 DIAGNOSIS — R2681 Unsteadiness on feet: Secondary | ICD-10-CM | POA: Diagnosis not present

## 2014-11-22 DIAGNOSIS — F0390 Unspecified dementia without behavioral disturbance: Secondary | ICD-10-CM | POA: Diagnosis not present

## 2014-11-22 DIAGNOSIS — Z79899 Other long term (current) drug therapy: Secondary | ICD-10-CM | POA: Diagnosis not present

## 2014-11-22 DIAGNOSIS — I10 Essential (primary) hypertension: Secondary | ICD-10-CM | POA: Diagnosis not present

## 2014-11-25 DIAGNOSIS — F329 Major depressive disorder, single episode, unspecified: Secondary | ICD-10-CM | POA: Diagnosis not present

## 2014-11-25 DIAGNOSIS — Z9181 History of falling: Secondary | ICD-10-CM | POA: Diagnosis not present

## 2014-11-25 DIAGNOSIS — F319 Bipolar disorder, unspecified: Secondary | ICD-10-CM | POA: Diagnosis not present

## 2014-11-25 DIAGNOSIS — R2681 Unsteadiness on feet: Secondary | ICD-10-CM | POA: Diagnosis not present

## 2014-11-25 DIAGNOSIS — I10 Essential (primary) hypertension: Secondary | ICD-10-CM | POA: Diagnosis not present

## 2014-11-25 DIAGNOSIS — F0390 Unspecified dementia without behavioral disturbance: Secondary | ICD-10-CM | POA: Diagnosis not present

## 2014-11-28 DIAGNOSIS — N390 Urinary tract infection, site not specified: Secondary | ICD-10-CM | POA: Diagnosis not present

## 2014-11-29 DIAGNOSIS — F319 Bipolar disorder, unspecified: Secondary | ICD-10-CM | POA: Diagnosis not present

## 2014-11-29 DIAGNOSIS — Z9181 History of falling: Secondary | ICD-10-CM | POA: Diagnosis not present

## 2014-11-29 DIAGNOSIS — F0390 Unspecified dementia without behavioral disturbance: Secondary | ICD-10-CM | POA: Diagnosis not present

## 2014-11-29 DIAGNOSIS — F329 Major depressive disorder, single episode, unspecified: Secondary | ICD-10-CM | POA: Diagnosis not present

## 2014-11-29 DIAGNOSIS — I10 Essential (primary) hypertension: Secondary | ICD-10-CM | POA: Diagnosis not present

## 2014-11-29 DIAGNOSIS — R2681 Unsteadiness on feet: Secondary | ICD-10-CM | POA: Diagnosis not present

## 2014-12-01 DIAGNOSIS — R351 Nocturia: Secondary | ICD-10-CM | POA: Diagnosis not present

## 2014-12-06 DIAGNOSIS — F0390 Unspecified dementia without behavioral disturbance: Secondary | ICD-10-CM | POA: Diagnosis not present

## 2014-12-06 DIAGNOSIS — F329 Major depressive disorder, single episode, unspecified: Secondary | ICD-10-CM | POA: Diagnosis not present

## 2014-12-06 DIAGNOSIS — R2681 Unsteadiness on feet: Secondary | ICD-10-CM | POA: Diagnosis not present

## 2014-12-06 DIAGNOSIS — I10 Essential (primary) hypertension: Secondary | ICD-10-CM | POA: Diagnosis not present

## 2014-12-06 DIAGNOSIS — F319 Bipolar disorder, unspecified: Secondary | ICD-10-CM | POA: Diagnosis not present

## 2014-12-06 DIAGNOSIS — Z9181 History of falling: Secondary | ICD-10-CM | POA: Diagnosis not present

## 2014-12-08 DIAGNOSIS — I10 Essential (primary) hypertension: Secondary | ICD-10-CM | POA: Diagnosis not present

## 2014-12-08 DIAGNOSIS — M25561 Pain in right knee: Secondary | ICD-10-CM | POA: Diagnosis not present

## 2014-12-08 DIAGNOSIS — L739 Follicular disorder, unspecified: Secondary | ICD-10-CM | POA: Diagnosis not present

## 2014-12-08 DIAGNOSIS — E785 Hyperlipidemia, unspecified: Secondary | ICD-10-CM | POA: Diagnosis not present

## 2014-12-08 DIAGNOSIS — M810 Age-related osteoporosis without current pathological fracture: Secondary | ICD-10-CM | POA: Diagnosis not present

## 2014-12-08 DIAGNOSIS — N429 Disorder of prostate, unspecified: Secondary | ICD-10-CM | POA: Diagnosis not present

## 2014-12-08 DIAGNOSIS — D649 Anemia, unspecified: Secondary | ICD-10-CM | POA: Diagnosis not present

## 2014-12-08 DIAGNOSIS — F0390 Unspecified dementia without behavioral disturbance: Secondary | ICD-10-CM | POA: Diagnosis not present

## 2014-12-13 DIAGNOSIS — R2681 Unsteadiness on feet: Secondary | ICD-10-CM | POA: Diagnosis not present

## 2014-12-13 DIAGNOSIS — I10 Essential (primary) hypertension: Secondary | ICD-10-CM | POA: Diagnosis not present

## 2014-12-13 DIAGNOSIS — F0390 Unspecified dementia without behavioral disturbance: Secondary | ICD-10-CM | POA: Diagnosis not present

## 2014-12-13 DIAGNOSIS — F329 Major depressive disorder, single episode, unspecified: Secondary | ICD-10-CM | POA: Diagnosis not present

## 2014-12-13 DIAGNOSIS — Z9181 History of falling: Secondary | ICD-10-CM | POA: Diagnosis not present

## 2014-12-13 DIAGNOSIS — F319 Bipolar disorder, unspecified: Secondary | ICD-10-CM | POA: Diagnosis not present

## 2014-12-19 DIAGNOSIS — Z79899 Other long term (current) drug therapy: Secondary | ICD-10-CM | POA: Diagnosis not present

## 2014-12-20 DIAGNOSIS — F0390 Unspecified dementia without behavioral disturbance: Secondary | ICD-10-CM | POA: Diagnosis not present

## 2014-12-20 DIAGNOSIS — R2681 Unsteadiness on feet: Secondary | ICD-10-CM | POA: Diagnosis not present

## 2014-12-20 DIAGNOSIS — F329 Major depressive disorder, single episode, unspecified: Secondary | ICD-10-CM | POA: Diagnosis not present

## 2014-12-20 DIAGNOSIS — Z9181 History of falling: Secondary | ICD-10-CM | POA: Diagnosis not present

## 2014-12-20 DIAGNOSIS — I10 Essential (primary) hypertension: Secondary | ICD-10-CM | POA: Diagnosis not present

## 2014-12-20 DIAGNOSIS — F319 Bipolar disorder, unspecified: Secondary | ICD-10-CM | POA: Diagnosis not present

## 2014-12-26 DIAGNOSIS — Z125 Encounter for screening for malignant neoplasm of prostate: Secondary | ICD-10-CM | POA: Diagnosis not present

## 2014-12-26 DIAGNOSIS — N401 Enlarged prostate with lower urinary tract symptoms: Secondary | ICD-10-CM | POA: Diagnosis not present

## 2014-12-26 DIAGNOSIS — R351 Nocturia: Secondary | ICD-10-CM | POA: Diagnosis not present

## 2014-12-27 DIAGNOSIS — F329 Major depressive disorder, single episode, unspecified: Secondary | ICD-10-CM | POA: Diagnosis not present

## 2014-12-27 DIAGNOSIS — Z9181 History of falling: Secondary | ICD-10-CM | POA: Diagnosis not present

## 2014-12-27 DIAGNOSIS — F0390 Unspecified dementia without behavioral disturbance: Secondary | ICD-10-CM | POA: Diagnosis not present

## 2014-12-27 DIAGNOSIS — I10 Essential (primary) hypertension: Secondary | ICD-10-CM | POA: Diagnosis not present

## 2014-12-27 DIAGNOSIS — F319 Bipolar disorder, unspecified: Secondary | ICD-10-CM | POA: Diagnosis not present

## 2014-12-27 DIAGNOSIS — R2681 Unsteadiness on feet: Secondary | ICD-10-CM | POA: Diagnosis not present

## 2014-12-29 DIAGNOSIS — Z23 Encounter for immunization: Secondary | ICD-10-CM | POA: Diagnosis not present

## 2014-12-29 DIAGNOSIS — M79603 Pain in arm, unspecified: Secondary | ICD-10-CM | POA: Diagnosis not present

## 2014-12-29 DIAGNOSIS — S42201D Unspecified fracture of upper end of right humerus, subsequent encounter for fracture with routine healing: Secondary | ICD-10-CM | POA: Diagnosis not present

## 2015-01-03 DIAGNOSIS — Z9181 History of falling: Secondary | ICD-10-CM | POA: Diagnosis not present

## 2015-01-03 DIAGNOSIS — R2681 Unsteadiness on feet: Secondary | ICD-10-CM | POA: Diagnosis not present

## 2015-01-03 DIAGNOSIS — F0390 Unspecified dementia without behavioral disturbance: Secondary | ICD-10-CM | POA: Diagnosis not present

## 2015-01-03 DIAGNOSIS — F329 Major depressive disorder, single episode, unspecified: Secondary | ICD-10-CM | POA: Diagnosis not present

## 2015-01-03 DIAGNOSIS — I10 Essential (primary) hypertension: Secondary | ICD-10-CM | POA: Diagnosis not present

## 2015-01-03 DIAGNOSIS — F319 Bipolar disorder, unspecified: Secondary | ICD-10-CM | POA: Diagnosis not present

## 2015-01-09 DIAGNOSIS — S42202A Unspecified fracture of upper end of left humerus, initial encounter for closed fracture: Secondary | ICD-10-CM | POA: Diagnosis not present

## 2015-01-10 DIAGNOSIS — F329 Major depressive disorder, single episode, unspecified: Secondary | ICD-10-CM | POA: Diagnosis not present

## 2015-01-10 DIAGNOSIS — F319 Bipolar disorder, unspecified: Secondary | ICD-10-CM | POA: Diagnosis not present

## 2015-01-10 DIAGNOSIS — F0390 Unspecified dementia without behavioral disturbance: Secondary | ICD-10-CM | POA: Diagnosis not present

## 2015-01-10 DIAGNOSIS — I10 Essential (primary) hypertension: Secondary | ICD-10-CM | POA: Diagnosis not present

## 2015-01-10 DIAGNOSIS — Z9181 History of falling: Secondary | ICD-10-CM | POA: Diagnosis not present

## 2015-01-10 DIAGNOSIS — R2681 Unsteadiness on feet: Secondary | ICD-10-CM | POA: Diagnosis not present

## 2015-01-17 DIAGNOSIS — F0390 Unspecified dementia without behavioral disturbance: Secondary | ICD-10-CM | POA: Diagnosis not present

## 2015-01-17 DIAGNOSIS — I10 Essential (primary) hypertension: Secondary | ICD-10-CM | POA: Diagnosis not present

## 2015-01-17 DIAGNOSIS — Z9181 History of falling: Secondary | ICD-10-CM | POA: Diagnosis not present

## 2015-01-17 DIAGNOSIS — F319 Bipolar disorder, unspecified: Secondary | ICD-10-CM | POA: Diagnosis not present

## 2015-01-17 DIAGNOSIS — F329 Major depressive disorder, single episode, unspecified: Secondary | ICD-10-CM | POA: Diagnosis not present

## 2015-01-17 DIAGNOSIS — R2681 Unsteadiness on feet: Secondary | ICD-10-CM | POA: Diagnosis not present

## 2015-01-24 DIAGNOSIS — R2681 Unsteadiness on feet: Secondary | ICD-10-CM | POA: Diagnosis not present

## 2015-01-24 DIAGNOSIS — M6281 Muscle weakness (generalized): Secondary | ICD-10-CM | POA: Diagnosis not present

## 2015-01-24 DIAGNOSIS — F319 Bipolar disorder, unspecified: Secondary | ICD-10-CM | POA: Diagnosis not present

## 2015-01-24 DIAGNOSIS — F0390 Unspecified dementia without behavioral disturbance: Secondary | ICD-10-CM | POA: Diagnosis not present

## 2015-01-24 DIAGNOSIS — Z742 Need for assistance at home and no other household member able to render care: Secondary | ICD-10-CM | POA: Diagnosis not present

## 2015-01-24 DIAGNOSIS — I10 Essential (primary) hypertension: Secondary | ICD-10-CM | POA: Diagnosis not present

## 2015-01-31 DIAGNOSIS — M6281 Muscle weakness (generalized): Secondary | ICD-10-CM | POA: Diagnosis not present

## 2015-01-31 DIAGNOSIS — I10 Essential (primary) hypertension: Secondary | ICD-10-CM | POA: Diagnosis not present

## 2015-01-31 DIAGNOSIS — R2681 Unsteadiness on feet: Secondary | ICD-10-CM | POA: Diagnosis not present

## 2015-01-31 DIAGNOSIS — F319 Bipolar disorder, unspecified: Secondary | ICD-10-CM | POA: Diagnosis not present

## 2015-01-31 DIAGNOSIS — Z742 Need for assistance at home and no other household member able to render care: Secondary | ICD-10-CM | POA: Diagnosis not present

## 2015-01-31 DIAGNOSIS — F0390 Unspecified dementia without behavioral disturbance: Secondary | ICD-10-CM | POA: Diagnosis not present

## 2015-02-06 DIAGNOSIS — R351 Nocturia: Secondary | ICD-10-CM | POA: Diagnosis not present

## 2015-02-06 DIAGNOSIS — R339 Retention of urine, unspecified: Secondary | ICD-10-CM | POA: Diagnosis not present

## 2015-02-06 DIAGNOSIS — N401 Enlarged prostate with lower urinary tract symptoms: Secondary | ICD-10-CM | POA: Diagnosis not present

## 2015-02-07 DIAGNOSIS — M6281 Muscle weakness (generalized): Secondary | ICD-10-CM | POA: Diagnosis not present

## 2015-02-07 DIAGNOSIS — I10 Essential (primary) hypertension: Secondary | ICD-10-CM | POA: Diagnosis not present

## 2015-02-07 DIAGNOSIS — F319 Bipolar disorder, unspecified: Secondary | ICD-10-CM | POA: Diagnosis not present

## 2015-02-07 DIAGNOSIS — Z742 Need for assistance at home and no other household member able to render care: Secondary | ICD-10-CM | POA: Diagnosis not present

## 2015-02-07 DIAGNOSIS — R2681 Unsteadiness on feet: Secondary | ICD-10-CM | POA: Diagnosis not present

## 2015-02-07 DIAGNOSIS — F0390 Unspecified dementia without behavioral disturbance: Secondary | ICD-10-CM | POA: Diagnosis not present

## 2015-02-09 DIAGNOSIS — M25512 Pain in left shoulder: Secondary | ICD-10-CM | POA: Diagnosis not present

## 2015-02-09 DIAGNOSIS — R54 Age-related physical debility: Secondary | ICD-10-CM | POA: Diagnosis not present

## 2015-02-09 DIAGNOSIS — M21922 Unspecified acquired deformity of left upper arm: Secondary | ICD-10-CM | POA: Diagnosis not present

## 2015-02-14 DIAGNOSIS — Z742 Need for assistance at home and no other household member able to render care: Secondary | ICD-10-CM | POA: Diagnosis not present

## 2015-02-14 DIAGNOSIS — R2681 Unsteadiness on feet: Secondary | ICD-10-CM | POA: Diagnosis not present

## 2015-02-14 DIAGNOSIS — F0390 Unspecified dementia without behavioral disturbance: Secondary | ICD-10-CM | POA: Diagnosis not present

## 2015-02-14 DIAGNOSIS — F319 Bipolar disorder, unspecified: Secondary | ICD-10-CM | POA: Diagnosis not present

## 2015-02-14 DIAGNOSIS — I10 Essential (primary) hypertension: Secondary | ICD-10-CM | POA: Diagnosis not present

## 2015-02-14 DIAGNOSIS — M6281 Muscle weakness (generalized): Secondary | ICD-10-CM | POA: Diagnosis not present

## 2015-02-21 DIAGNOSIS — R2681 Unsteadiness on feet: Secondary | ICD-10-CM | POA: Diagnosis not present

## 2015-02-21 DIAGNOSIS — Z742 Need for assistance at home and no other household member able to render care: Secondary | ICD-10-CM | POA: Diagnosis not present

## 2015-02-21 DIAGNOSIS — M6281 Muscle weakness (generalized): Secondary | ICD-10-CM | POA: Diagnosis not present

## 2015-02-21 DIAGNOSIS — F319 Bipolar disorder, unspecified: Secondary | ICD-10-CM | POA: Diagnosis not present

## 2015-02-21 DIAGNOSIS — F0390 Unspecified dementia without behavioral disturbance: Secondary | ICD-10-CM | POA: Diagnosis not present

## 2015-02-21 DIAGNOSIS — I10 Essential (primary) hypertension: Secondary | ICD-10-CM | POA: Diagnosis not present

## 2015-02-28 DIAGNOSIS — I10 Essential (primary) hypertension: Secondary | ICD-10-CM | POA: Diagnosis not present

## 2015-02-28 DIAGNOSIS — F319 Bipolar disorder, unspecified: Secondary | ICD-10-CM | POA: Diagnosis not present

## 2015-02-28 DIAGNOSIS — Z742 Need for assistance at home and no other household member able to render care: Secondary | ICD-10-CM | POA: Diagnosis not present

## 2015-02-28 DIAGNOSIS — R2681 Unsteadiness on feet: Secondary | ICD-10-CM | POA: Diagnosis not present

## 2015-02-28 DIAGNOSIS — M6281 Muscle weakness (generalized): Secondary | ICD-10-CM | POA: Diagnosis not present

## 2015-02-28 DIAGNOSIS — F0390 Unspecified dementia without behavioral disturbance: Secondary | ICD-10-CM | POA: Diagnosis not present

## 2015-03-07 DIAGNOSIS — F0390 Unspecified dementia without behavioral disturbance: Secondary | ICD-10-CM | POA: Diagnosis not present

## 2015-03-07 DIAGNOSIS — F319 Bipolar disorder, unspecified: Secondary | ICD-10-CM | POA: Diagnosis not present

## 2015-03-07 DIAGNOSIS — Z742 Need for assistance at home and no other household member able to render care: Secondary | ICD-10-CM | POA: Diagnosis not present

## 2015-03-07 DIAGNOSIS — M6281 Muscle weakness (generalized): Secondary | ICD-10-CM | POA: Diagnosis not present

## 2015-03-07 DIAGNOSIS — R2681 Unsteadiness on feet: Secondary | ICD-10-CM | POA: Diagnosis not present

## 2015-03-07 DIAGNOSIS — I10 Essential (primary) hypertension: Secondary | ICD-10-CM | POA: Diagnosis not present

## 2015-03-09 DIAGNOSIS — F3110 Bipolar disorder, current episode manic without psychotic features, unspecified: Secondary | ICD-10-CM | POA: Diagnosis not present

## 2015-03-09 DIAGNOSIS — D649 Anemia, unspecified: Secondary | ICD-10-CM | POA: Diagnosis not present

## 2015-03-09 DIAGNOSIS — I10 Essential (primary) hypertension: Secondary | ICD-10-CM | POA: Diagnosis not present

## 2015-03-09 DIAGNOSIS — E785 Hyperlipidemia, unspecified: Secondary | ICD-10-CM | POA: Diagnosis not present

## 2015-03-16 DIAGNOSIS — M6281 Muscle weakness (generalized): Secondary | ICD-10-CM | POA: Diagnosis not present

## 2015-03-16 DIAGNOSIS — Z742 Need for assistance at home and no other household member able to render care: Secondary | ICD-10-CM | POA: Diagnosis not present

## 2015-03-16 DIAGNOSIS — G4700 Insomnia, unspecified: Secondary | ICD-10-CM | POA: Diagnosis not present

## 2015-03-16 DIAGNOSIS — R2681 Unsteadiness on feet: Secondary | ICD-10-CM | POA: Diagnosis not present

## 2015-03-16 DIAGNOSIS — F319 Bipolar disorder, unspecified: Secondary | ICD-10-CM | POA: Diagnosis not present

## 2015-03-16 DIAGNOSIS — F0390 Unspecified dementia without behavioral disturbance: Secondary | ICD-10-CM | POA: Diagnosis not present

## 2015-03-16 DIAGNOSIS — I10 Essential (primary) hypertension: Secondary | ICD-10-CM | POA: Diagnosis not present

## 2015-03-20 DIAGNOSIS — N401 Enlarged prostate with lower urinary tract symptoms: Secondary | ICD-10-CM | POA: Diagnosis not present

## 2015-03-20 DIAGNOSIS — R351 Nocturia: Secondary | ICD-10-CM | POA: Diagnosis not present

## 2015-03-20 DIAGNOSIS — R339 Retention of urine, unspecified: Secondary | ICD-10-CM | POA: Diagnosis not present

## 2015-03-21 DIAGNOSIS — S0081XA Abrasion of other part of head, initial encounter: Secondary | ICD-10-CM | POA: Diagnosis not present

## 2015-03-21 DIAGNOSIS — S0010XA Contusion of unspecified eyelid and periocular area, initial encounter: Secondary | ICD-10-CM | POA: Diagnosis not present

## 2015-03-21 DIAGNOSIS — T148 Other injury of unspecified body region: Secondary | ICD-10-CM | POA: Diagnosis not present

## 2015-03-21 DIAGNOSIS — S50311A Abrasion of right elbow, initial encounter: Secondary | ICD-10-CM | POA: Diagnosis not present

## 2015-03-21 DIAGNOSIS — S0083XA Contusion of other part of head, initial encounter: Secondary | ICD-10-CM | POA: Diagnosis not present

## 2015-03-22 DIAGNOSIS — R259 Unspecified abnormal involuntary movements: Secondary | ICD-10-CM | POA: Diagnosis not present

## 2015-03-23 DIAGNOSIS — M6281 Muscle weakness (generalized): Secondary | ICD-10-CM | POA: Diagnosis not present

## 2015-03-23 DIAGNOSIS — S0081XS Abrasion of other part of head, sequela: Secondary | ICD-10-CM | POA: Diagnosis not present

## 2015-03-23 DIAGNOSIS — R269 Unspecified abnormalities of gait and mobility: Secondary | ICD-10-CM | POA: Diagnosis not present

## 2015-03-23 DIAGNOSIS — S50311S Abrasion of right elbow, sequela: Secondary | ICD-10-CM | POA: Diagnosis not present

## 2015-03-23 DIAGNOSIS — S0003XS Contusion of scalp, sequela: Secondary | ICD-10-CM | POA: Diagnosis not present

## 2015-03-23 DIAGNOSIS — R54 Age-related physical debility: Secondary | ICD-10-CM | POA: Diagnosis not present

## 2015-03-23 DIAGNOSIS — W19XXXS Unspecified fall, sequela: Secondary | ICD-10-CM | POA: Diagnosis not present

## 2015-03-25 DIAGNOSIS — R2681 Unsteadiness on feet: Secondary | ICD-10-CM | POA: Diagnosis not present

## 2015-03-25 DIAGNOSIS — F0390 Unspecified dementia without behavioral disturbance: Secondary | ICD-10-CM | POA: Diagnosis not present

## 2015-03-25 DIAGNOSIS — Z742 Need for assistance at home and no other household member able to render care: Secondary | ICD-10-CM | POA: Diagnosis not present

## 2015-03-25 DIAGNOSIS — M6281 Muscle weakness (generalized): Secondary | ICD-10-CM | POA: Diagnosis not present

## 2015-03-25 DIAGNOSIS — Z79891 Long term (current) use of opiate analgesic: Secondary | ICD-10-CM | POA: Diagnosis not present

## 2015-03-25 DIAGNOSIS — Z9181 History of falling: Secondary | ICD-10-CM | POA: Diagnosis not present

## 2015-03-25 DIAGNOSIS — F319 Bipolar disorder, unspecified: Secondary | ICD-10-CM | POA: Diagnosis not present

## 2015-03-25 DIAGNOSIS — I10 Essential (primary) hypertension: Secondary | ICD-10-CM | POA: Diagnosis not present

## 2015-03-28 DIAGNOSIS — Z742 Need for assistance at home and no other household member able to render care: Secondary | ICD-10-CM | POA: Diagnosis not present

## 2015-03-28 DIAGNOSIS — M6281 Muscle weakness (generalized): Secondary | ICD-10-CM | POA: Diagnosis not present

## 2015-03-28 DIAGNOSIS — I10 Essential (primary) hypertension: Secondary | ICD-10-CM | POA: Diagnosis not present

## 2015-03-28 DIAGNOSIS — F0390 Unspecified dementia without behavioral disturbance: Secondary | ICD-10-CM | POA: Diagnosis not present

## 2015-03-28 DIAGNOSIS — R2681 Unsteadiness on feet: Secondary | ICD-10-CM | POA: Diagnosis not present

## 2015-03-28 DIAGNOSIS — F319 Bipolar disorder, unspecified: Secondary | ICD-10-CM | POA: Diagnosis not present

## 2015-03-30 DIAGNOSIS — N1330 Unspecified hydronephrosis: Secondary | ICD-10-CM | POA: Diagnosis not present

## 2015-03-30 DIAGNOSIS — R338 Other retention of urine: Secondary | ICD-10-CM | POA: Diagnosis not present

## 2015-03-30 DIAGNOSIS — N289 Disorder of kidney and ureter, unspecified: Secondary | ICD-10-CM | POA: Diagnosis not present

## 2015-03-30 DIAGNOSIS — R339 Retention of urine, unspecified: Secondary | ICD-10-CM | POA: Diagnosis not present

## 2015-04-04 DIAGNOSIS — R2681 Unsteadiness on feet: Secondary | ICD-10-CM | POA: Diagnosis not present

## 2015-04-04 DIAGNOSIS — F319 Bipolar disorder, unspecified: Secondary | ICD-10-CM | POA: Diagnosis not present

## 2015-04-04 DIAGNOSIS — M6281 Muscle weakness (generalized): Secondary | ICD-10-CM | POA: Diagnosis not present

## 2015-04-04 DIAGNOSIS — F0390 Unspecified dementia without behavioral disturbance: Secondary | ICD-10-CM | POA: Diagnosis not present

## 2015-04-04 DIAGNOSIS — I10 Essential (primary) hypertension: Secondary | ICD-10-CM | POA: Diagnosis not present

## 2015-04-04 DIAGNOSIS — Z742 Need for assistance at home and no other household member able to render care: Secondary | ICD-10-CM | POA: Diagnosis not present

## 2015-04-11 DIAGNOSIS — Z742 Need for assistance at home and no other household member able to render care: Secondary | ICD-10-CM | POA: Diagnosis not present

## 2015-04-11 DIAGNOSIS — I10 Essential (primary) hypertension: Secondary | ICD-10-CM | POA: Diagnosis not present

## 2015-04-11 DIAGNOSIS — R2681 Unsteadiness on feet: Secondary | ICD-10-CM | POA: Diagnosis not present

## 2015-04-11 DIAGNOSIS — F0390 Unspecified dementia without behavioral disturbance: Secondary | ICD-10-CM | POA: Diagnosis not present

## 2015-04-11 DIAGNOSIS — M6281 Muscle weakness (generalized): Secondary | ICD-10-CM | POA: Diagnosis not present

## 2015-04-11 DIAGNOSIS — F319 Bipolar disorder, unspecified: Secondary | ICD-10-CM | POA: Diagnosis not present

## 2015-04-13 DIAGNOSIS — J309 Allergic rhinitis, unspecified: Secondary | ICD-10-CM | POA: Diagnosis not present

## 2015-04-13 DIAGNOSIS — R54 Age-related physical debility: Secondary | ICD-10-CM | POA: Diagnosis not present

## 2015-04-18 DIAGNOSIS — F0390 Unspecified dementia without behavioral disturbance: Secondary | ICD-10-CM | POA: Diagnosis not present

## 2015-04-18 DIAGNOSIS — F319 Bipolar disorder, unspecified: Secondary | ICD-10-CM | POA: Diagnosis not present

## 2015-04-18 DIAGNOSIS — I10 Essential (primary) hypertension: Secondary | ICD-10-CM | POA: Diagnosis not present

## 2015-04-18 DIAGNOSIS — Z742 Need for assistance at home and no other household member able to render care: Secondary | ICD-10-CM | POA: Diagnosis not present

## 2015-04-18 DIAGNOSIS — R2681 Unsteadiness on feet: Secondary | ICD-10-CM | POA: Diagnosis not present

## 2015-04-18 DIAGNOSIS — M6281 Muscle weakness (generalized): Secondary | ICD-10-CM | POA: Diagnosis not present

## 2015-04-25 DIAGNOSIS — Z742 Need for assistance at home and no other household member able to render care: Secondary | ICD-10-CM | POA: Diagnosis not present

## 2015-04-25 DIAGNOSIS — I10 Essential (primary) hypertension: Secondary | ICD-10-CM | POA: Diagnosis not present

## 2015-04-25 DIAGNOSIS — F0390 Unspecified dementia without behavioral disturbance: Secondary | ICD-10-CM | POA: Diagnosis not present

## 2015-04-25 DIAGNOSIS — F319 Bipolar disorder, unspecified: Secondary | ICD-10-CM | POA: Diagnosis not present

## 2015-04-25 DIAGNOSIS — R2681 Unsteadiness on feet: Secondary | ICD-10-CM | POA: Diagnosis not present

## 2015-04-25 DIAGNOSIS — M6281 Muscle weakness (generalized): Secondary | ICD-10-CM | POA: Diagnosis not present

## 2015-05-02 DIAGNOSIS — F319 Bipolar disorder, unspecified: Secondary | ICD-10-CM | POA: Diagnosis not present

## 2015-05-02 DIAGNOSIS — M6281 Muscle weakness (generalized): Secondary | ICD-10-CM | POA: Diagnosis not present

## 2015-05-02 DIAGNOSIS — I10 Essential (primary) hypertension: Secondary | ICD-10-CM | POA: Diagnosis not present

## 2015-05-02 DIAGNOSIS — F0390 Unspecified dementia without behavioral disturbance: Secondary | ICD-10-CM | POA: Diagnosis not present

## 2015-05-02 DIAGNOSIS — Z742 Need for assistance at home and no other household member able to render care: Secondary | ICD-10-CM | POA: Diagnosis not present

## 2015-05-02 DIAGNOSIS — R2681 Unsteadiness on feet: Secondary | ICD-10-CM | POA: Diagnosis not present

## 2015-05-08 DIAGNOSIS — R05 Cough: Secondary | ICD-10-CM | POA: Diagnosis not present

## 2015-05-09 DIAGNOSIS — R2681 Unsteadiness on feet: Secondary | ICD-10-CM | POA: Diagnosis not present

## 2015-05-09 DIAGNOSIS — Z742 Need for assistance at home and no other household member able to render care: Secondary | ICD-10-CM | POA: Diagnosis not present

## 2015-05-09 DIAGNOSIS — M6281 Muscle weakness (generalized): Secondary | ICD-10-CM | POA: Diagnosis not present

## 2015-05-09 DIAGNOSIS — F0390 Unspecified dementia without behavioral disturbance: Secondary | ICD-10-CM | POA: Diagnosis not present

## 2015-05-09 DIAGNOSIS — F319 Bipolar disorder, unspecified: Secondary | ICD-10-CM | POA: Diagnosis not present

## 2015-05-09 DIAGNOSIS — I10 Essential (primary) hypertension: Secondary | ICD-10-CM | POA: Diagnosis not present

## 2015-05-11 DIAGNOSIS — W19XXXA Unspecified fall, initial encounter: Secondary | ICD-10-CM | POA: Diagnosis not present

## 2015-05-11 DIAGNOSIS — M6281 Muscle weakness (generalized): Secondary | ICD-10-CM | POA: Diagnosis not present

## 2015-05-11 DIAGNOSIS — R54 Age-related physical debility: Secondary | ICD-10-CM | POA: Diagnosis not present

## 2015-05-11 DIAGNOSIS — Z9181 History of falling: Secondary | ICD-10-CM | POA: Diagnosis not present

## 2015-05-16 DIAGNOSIS — M6281 Muscle weakness (generalized): Secondary | ICD-10-CM | POA: Diagnosis not present

## 2015-05-16 DIAGNOSIS — R2681 Unsteadiness on feet: Secondary | ICD-10-CM | POA: Diagnosis not present

## 2015-05-16 DIAGNOSIS — F0390 Unspecified dementia without behavioral disturbance: Secondary | ICD-10-CM | POA: Diagnosis not present

## 2015-05-16 DIAGNOSIS — F319 Bipolar disorder, unspecified: Secondary | ICD-10-CM | POA: Diagnosis not present

## 2015-05-16 DIAGNOSIS — L609 Nail disorder, unspecified: Secondary | ICD-10-CM | POA: Diagnosis not present

## 2015-05-16 DIAGNOSIS — Z742 Need for assistance at home and no other household member able to render care: Secondary | ICD-10-CM | POA: Diagnosis not present

## 2015-05-16 DIAGNOSIS — I10 Essential (primary) hypertension: Secondary | ICD-10-CM | POA: Diagnosis not present

## 2015-05-16 DIAGNOSIS — B351 Tinea unguium: Secondary | ICD-10-CM | POA: Diagnosis not present

## 2015-05-23 DIAGNOSIS — R2681 Unsteadiness on feet: Secondary | ICD-10-CM | POA: Diagnosis not present

## 2015-05-23 DIAGNOSIS — I10 Essential (primary) hypertension: Secondary | ICD-10-CM | POA: Diagnosis not present

## 2015-05-23 DIAGNOSIS — Z742 Need for assistance at home and no other household member able to render care: Secondary | ICD-10-CM | POA: Diagnosis not present

## 2015-05-23 DIAGNOSIS — F319 Bipolar disorder, unspecified: Secondary | ICD-10-CM | POA: Diagnosis not present

## 2015-05-23 DIAGNOSIS — M6281 Muscle weakness (generalized): Secondary | ICD-10-CM | POA: Diagnosis not present

## 2015-05-23 DIAGNOSIS — F0390 Unspecified dementia without behavioral disturbance: Secondary | ICD-10-CM | POA: Diagnosis not present

## 2015-05-24 DIAGNOSIS — I10 Essential (primary) hypertension: Secondary | ICD-10-CM | POA: Diagnosis not present

## 2015-05-24 DIAGNOSIS — M6281 Muscle weakness (generalized): Secondary | ICD-10-CM | POA: Diagnosis not present

## 2015-05-24 DIAGNOSIS — Z9181 History of falling: Secondary | ICD-10-CM | POA: Diagnosis not present

## 2015-05-24 DIAGNOSIS — R2681 Unsteadiness on feet: Secondary | ICD-10-CM | POA: Diagnosis not present

## 2015-05-24 DIAGNOSIS — F319 Bipolar disorder, unspecified: Secondary | ICD-10-CM | POA: Diagnosis not present

## 2015-05-24 DIAGNOSIS — F0390 Unspecified dementia without behavioral disturbance: Secondary | ICD-10-CM | POA: Diagnosis not present

## 2015-05-24 DIAGNOSIS — Z79891 Long term (current) use of opiate analgesic: Secondary | ICD-10-CM | POA: Diagnosis not present

## 2015-05-24 DIAGNOSIS — Z742 Need for assistance at home and no other household member able to render care: Secondary | ICD-10-CM | POA: Diagnosis not present

## 2015-05-25 DIAGNOSIS — R269 Unspecified abnormalities of gait and mobility: Secondary | ICD-10-CM | POA: Diagnosis not present

## 2015-05-25 DIAGNOSIS — W19XXXA Unspecified fall, initial encounter: Secondary | ICD-10-CM | POA: Diagnosis not present

## 2015-05-25 DIAGNOSIS — M6281 Muscle weakness (generalized): Secondary | ICD-10-CM | POA: Diagnosis not present

## 2015-05-25 DIAGNOSIS — R54 Age-related physical debility: Secondary | ICD-10-CM | POA: Diagnosis not present

## 2015-05-30 DIAGNOSIS — I10 Essential (primary) hypertension: Secondary | ICD-10-CM | POA: Diagnosis not present

## 2015-05-30 DIAGNOSIS — R2681 Unsteadiness on feet: Secondary | ICD-10-CM | POA: Diagnosis not present

## 2015-05-30 DIAGNOSIS — M6281 Muscle weakness (generalized): Secondary | ICD-10-CM | POA: Diagnosis not present

## 2015-05-30 DIAGNOSIS — F0390 Unspecified dementia without behavioral disturbance: Secondary | ICD-10-CM | POA: Diagnosis not present

## 2015-05-30 DIAGNOSIS — F319 Bipolar disorder, unspecified: Secondary | ICD-10-CM | POA: Diagnosis not present

## 2015-05-30 DIAGNOSIS — Z742 Need for assistance at home and no other household member able to render care: Secondary | ICD-10-CM | POA: Diagnosis not present

## 2015-06-01 DIAGNOSIS — G4700 Insomnia, unspecified: Secondary | ICD-10-CM | POA: Diagnosis not present

## 2015-06-01 DIAGNOSIS — E785 Hyperlipidemia, unspecified: Secondary | ICD-10-CM | POA: Diagnosis not present

## 2015-06-01 DIAGNOSIS — I10 Essential (primary) hypertension: Secondary | ICD-10-CM | POA: Diagnosis not present

## 2015-06-01 DIAGNOSIS — D649 Anemia, unspecified: Secondary | ICD-10-CM | POA: Diagnosis not present

## 2015-06-06 DIAGNOSIS — F319 Bipolar disorder, unspecified: Secondary | ICD-10-CM | POA: Diagnosis not present

## 2015-06-06 DIAGNOSIS — F0390 Unspecified dementia without behavioral disturbance: Secondary | ICD-10-CM | POA: Diagnosis not present

## 2015-06-06 DIAGNOSIS — Z742 Need for assistance at home and no other household member able to render care: Secondary | ICD-10-CM | POA: Diagnosis not present

## 2015-06-06 DIAGNOSIS — M6281 Muscle weakness (generalized): Secondary | ICD-10-CM | POA: Diagnosis not present

## 2015-06-06 DIAGNOSIS — I10 Essential (primary) hypertension: Secondary | ICD-10-CM | POA: Diagnosis not present

## 2015-06-06 DIAGNOSIS — R2681 Unsteadiness on feet: Secondary | ICD-10-CM | POA: Diagnosis not present

## 2015-06-13 DIAGNOSIS — Z742 Need for assistance at home and no other household member able to render care: Secondary | ICD-10-CM | POA: Diagnosis not present

## 2015-06-13 DIAGNOSIS — I10 Essential (primary) hypertension: Secondary | ICD-10-CM | POA: Diagnosis not present

## 2015-06-13 DIAGNOSIS — M6281 Muscle weakness (generalized): Secondary | ICD-10-CM | POA: Diagnosis not present

## 2015-06-13 DIAGNOSIS — F0390 Unspecified dementia without behavioral disturbance: Secondary | ICD-10-CM | POA: Diagnosis not present

## 2015-06-13 DIAGNOSIS — R2681 Unsteadiness on feet: Secondary | ICD-10-CM | POA: Diagnosis not present

## 2015-06-13 DIAGNOSIS — F319 Bipolar disorder, unspecified: Secondary | ICD-10-CM | POA: Diagnosis not present

## 2015-06-20 DIAGNOSIS — F0390 Unspecified dementia without behavioral disturbance: Secondary | ICD-10-CM | POA: Diagnosis not present

## 2015-06-20 DIAGNOSIS — F319 Bipolar disorder, unspecified: Secondary | ICD-10-CM | POA: Diagnosis not present

## 2015-06-20 DIAGNOSIS — R2681 Unsteadiness on feet: Secondary | ICD-10-CM | POA: Diagnosis not present

## 2015-06-20 DIAGNOSIS — M6281 Muscle weakness (generalized): Secondary | ICD-10-CM | POA: Diagnosis not present

## 2015-06-20 DIAGNOSIS — I10 Essential (primary) hypertension: Secondary | ICD-10-CM | POA: Diagnosis not present

## 2015-06-20 DIAGNOSIS — Z742 Need for assistance at home and no other household member able to render care: Secondary | ICD-10-CM | POA: Diagnosis not present

## 2015-06-27 DIAGNOSIS — R2681 Unsteadiness on feet: Secondary | ICD-10-CM | POA: Diagnosis not present

## 2015-06-27 DIAGNOSIS — F319 Bipolar disorder, unspecified: Secondary | ICD-10-CM | POA: Diagnosis not present

## 2015-06-27 DIAGNOSIS — I10 Essential (primary) hypertension: Secondary | ICD-10-CM | POA: Diagnosis not present

## 2015-06-27 DIAGNOSIS — F0390 Unspecified dementia without behavioral disturbance: Secondary | ICD-10-CM | POA: Diagnosis not present

## 2015-06-27 DIAGNOSIS — Z742 Need for assistance at home and no other household member able to render care: Secondary | ICD-10-CM | POA: Diagnosis not present

## 2015-06-27 DIAGNOSIS — M6281 Muscle weakness (generalized): Secondary | ICD-10-CM | POA: Diagnosis not present

## 2015-07-04 DIAGNOSIS — Z742 Need for assistance at home and no other household member able to render care: Secondary | ICD-10-CM | POA: Diagnosis not present

## 2015-07-04 DIAGNOSIS — I10 Essential (primary) hypertension: Secondary | ICD-10-CM | POA: Diagnosis not present

## 2015-07-04 DIAGNOSIS — F0390 Unspecified dementia without behavioral disturbance: Secondary | ICD-10-CM | POA: Diagnosis not present

## 2015-07-04 DIAGNOSIS — M6281 Muscle weakness (generalized): Secondary | ICD-10-CM | POA: Diagnosis not present

## 2015-07-04 DIAGNOSIS — F319 Bipolar disorder, unspecified: Secondary | ICD-10-CM | POA: Diagnosis not present

## 2015-07-04 DIAGNOSIS — R2681 Unsteadiness on feet: Secondary | ICD-10-CM | POA: Diagnosis not present

## 2015-07-11 DIAGNOSIS — M6281 Muscle weakness (generalized): Secondary | ICD-10-CM | POA: Diagnosis not present

## 2015-07-11 DIAGNOSIS — F0390 Unspecified dementia without behavioral disturbance: Secondary | ICD-10-CM | POA: Diagnosis not present

## 2015-07-11 DIAGNOSIS — F319 Bipolar disorder, unspecified: Secondary | ICD-10-CM | POA: Diagnosis not present

## 2015-07-11 DIAGNOSIS — Z742 Need for assistance at home and no other household member able to render care: Secondary | ICD-10-CM | POA: Diagnosis not present

## 2015-07-11 DIAGNOSIS — R2681 Unsteadiness on feet: Secondary | ICD-10-CM | POA: Diagnosis not present

## 2015-07-11 DIAGNOSIS — I10 Essential (primary) hypertension: Secondary | ICD-10-CM | POA: Diagnosis not present

## 2015-07-18 DIAGNOSIS — F319 Bipolar disorder, unspecified: Secondary | ICD-10-CM | POA: Diagnosis not present

## 2015-07-18 DIAGNOSIS — F0390 Unspecified dementia without behavioral disturbance: Secondary | ICD-10-CM | POA: Diagnosis not present

## 2015-07-18 DIAGNOSIS — I10 Essential (primary) hypertension: Secondary | ICD-10-CM | POA: Diagnosis not present

## 2015-07-18 DIAGNOSIS — M6281 Muscle weakness (generalized): Secondary | ICD-10-CM | POA: Diagnosis not present

## 2015-07-18 DIAGNOSIS — Z742 Need for assistance at home and no other household member able to render care: Secondary | ICD-10-CM | POA: Diagnosis not present

## 2015-07-18 DIAGNOSIS — R2681 Unsteadiness on feet: Secondary | ICD-10-CM | POA: Diagnosis not present

## 2015-07-20 DIAGNOSIS — N401 Enlarged prostate with lower urinary tract symptoms: Secondary | ICD-10-CM | POA: Diagnosis not present

## 2015-07-20 DIAGNOSIS — R351 Nocturia: Secondary | ICD-10-CM | POA: Diagnosis not present

## 2015-07-23 DIAGNOSIS — R2681 Unsteadiness on feet: Secondary | ICD-10-CM | POA: Diagnosis not present

## 2015-07-23 DIAGNOSIS — F0390 Unspecified dementia without behavioral disturbance: Secondary | ICD-10-CM | POA: Diagnosis not present

## 2015-07-23 DIAGNOSIS — Z742 Need for assistance at home and no other household member able to render care: Secondary | ICD-10-CM | POA: Diagnosis not present

## 2015-07-23 DIAGNOSIS — Z9181 History of falling: Secondary | ICD-10-CM | POA: Diagnosis not present

## 2015-07-23 DIAGNOSIS — I10 Essential (primary) hypertension: Secondary | ICD-10-CM | POA: Diagnosis not present

## 2015-07-23 DIAGNOSIS — F319 Bipolar disorder, unspecified: Secondary | ICD-10-CM | POA: Diagnosis not present

## 2015-07-23 DIAGNOSIS — M6281 Muscle weakness (generalized): Secondary | ICD-10-CM | POA: Diagnosis not present

## 2015-07-23 DIAGNOSIS — Z79891 Long term (current) use of opiate analgesic: Secondary | ICD-10-CM | POA: Diagnosis not present

## 2015-07-25 DIAGNOSIS — F0390 Unspecified dementia without behavioral disturbance: Secondary | ICD-10-CM | POA: Diagnosis not present

## 2015-07-25 DIAGNOSIS — M6281 Muscle weakness (generalized): Secondary | ICD-10-CM | POA: Diagnosis not present

## 2015-07-25 DIAGNOSIS — F319 Bipolar disorder, unspecified: Secondary | ICD-10-CM | POA: Diagnosis not present

## 2015-07-25 DIAGNOSIS — I10 Essential (primary) hypertension: Secondary | ICD-10-CM | POA: Diagnosis not present

## 2015-07-25 DIAGNOSIS — R2681 Unsteadiness on feet: Secondary | ICD-10-CM | POA: Diagnosis not present

## 2015-07-25 DIAGNOSIS — Z742 Need for assistance at home and no other household member able to render care: Secondary | ICD-10-CM | POA: Diagnosis not present

## 2015-08-01 DIAGNOSIS — F0390 Unspecified dementia without behavioral disturbance: Secondary | ICD-10-CM | POA: Diagnosis not present

## 2015-08-01 DIAGNOSIS — M6281 Muscle weakness (generalized): Secondary | ICD-10-CM | POA: Diagnosis not present

## 2015-08-01 DIAGNOSIS — R2681 Unsteadiness on feet: Secondary | ICD-10-CM | POA: Diagnosis not present

## 2015-08-01 DIAGNOSIS — Z742 Need for assistance at home and no other household member able to render care: Secondary | ICD-10-CM | POA: Diagnosis not present

## 2015-08-01 DIAGNOSIS — F319 Bipolar disorder, unspecified: Secondary | ICD-10-CM | POA: Diagnosis not present

## 2015-08-01 DIAGNOSIS — I10 Essential (primary) hypertension: Secondary | ICD-10-CM | POA: Diagnosis not present

## 2015-08-08 DIAGNOSIS — F319 Bipolar disorder, unspecified: Secondary | ICD-10-CM | POA: Diagnosis not present

## 2015-08-08 DIAGNOSIS — B351 Tinea unguium: Secondary | ICD-10-CM | POA: Diagnosis not present

## 2015-08-08 DIAGNOSIS — Z742 Need for assistance at home and no other household member able to render care: Secondary | ICD-10-CM | POA: Diagnosis not present

## 2015-08-08 DIAGNOSIS — M6281 Muscle weakness (generalized): Secondary | ICD-10-CM | POA: Diagnosis not present

## 2015-08-08 DIAGNOSIS — L609 Nail disorder, unspecified: Secondary | ICD-10-CM | POA: Diagnosis not present

## 2015-08-08 DIAGNOSIS — I10 Essential (primary) hypertension: Secondary | ICD-10-CM | POA: Diagnosis not present

## 2015-08-08 DIAGNOSIS — R2681 Unsteadiness on feet: Secondary | ICD-10-CM | POA: Diagnosis not present

## 2015-08-08 DIAGNOSIS — F0390 Unspecified dementia without behavioral disturbance: Secondary | ICD-10-CM | POA: Diagnosis not present

## 2015-08-15 DIAGNOSIS — I10 Essential (primary) hypertension: Secondary | ICD-10-CM | POA: Diagnosis not present

## 2015-08-15 DIAGNOSIS — Z742 Need for assistance at home and no other household member able to render care: Secondary | ICD-10-CM | POA: Diagnosis not present

## 2015-08-15 DIAGNOSIS — F319 Bipolar disorder, unspecified: Secondary | ICD-10-CM | POA: Diagnosis not present

## 2015-08-15 DIAGNOSIS — M6281 Muscle weakness (generalized): Secondary | ICD-10-CM | POA: Diagnosis not present

## 2015-08-15 DIAGNOSIS — R2681 Unsteadiness on feet: Secondary | ICD-10-CM | POA: Diagnosis not present

## 2015-08-15 DIAGNOSIS — F0390 Unspecified dementia without behavioral disturbance: Secondary | ICD-10-CM | POA: Diagnosis not present

## 2015-08-22 DIAGNOSIS — I10 Essential (primary) hypertension: Secondary | ICD-10-CM | POA: Diagnosis not present

## 2015-08-22 DIAGNOSIS — M6281 Muscle weakness (generalized): Secondary | ICD-10-CM | POA: Diagnosis not present

## 2015-08-22 DIAGNOSIS — F0390 Unspecified dementia without behavioral disturbance: Secondary | ICD-10-CM | POA: Diagnosis not present

## 2015-08-22 DIAGNOSIS — Z742 Need for assistance at home and no other household member able to render care: Secondary | ICD-10-CM | POA: Diagnosis not present

## 2015-08-22 DIAGNOSIS — F319 Bipolar disorder, unspecified: Secondary | ICD-10-CM | POA: Diagnosis not present

## 2015-08-22 DIAGNOSIS — R2681 Unsteadiness on feet: Secondary | ICD-10-CM | POA: Diagnosis not present

## 2015-08-24 DIAGNOSIS — G4700 Insomnia, unspecified: Secondary | ICD-10-CM | POA: Diagnosis not present

## 2015-08-24 DIAGNOSIS — I10 Essential (primary) hypertension: Secondary | ICD-10-CM | POA: Diagnosis not present

## 2015-08-24 DIAGNOSIS — D649 Anemia, unspecified: Secondary | ICD-10-CM | POA: Diagnosis not present

## 2015-08-24 DIAGNOSIS — F3110 Bipolar disorder, current episode manic without psychotic features, unspecified: Secondary | ICD-10-CM | POA: Diagnosis not present

## 2015-08-29 DIAGNOSIS — Z742 Need for assistance at home and no other household member able to render care: Secondary | ICD-10-CM | POA: Diagnosis not present

## 2015-08-29 DIAGNOSIS — F319 Bipolar disorder, unspecified: Secondary | ICD-10-CM | POA: Diagnosis not present

## 2015-08-29 DIAGNOSIS — M6281 Muscle weakness (generalized): Secondary | ICD-10-CM | POA: Diagnosis not present

## 2015-08-29 DIAGNOSIS — R2681 Unsteadiness on feet: Secondary | ICD-10-CM | POA: Diagnosis not present

## 2015-08-29 DIAGNOSIS — I10 Essential (primary) hypertension: Secondary | ICD-10-CM | POA: Diagnosis not present

## 2015-08-29 DIAGNOSIS — F0390 Unspecified dementia without behavioral disturbance: Secondary | ICD-10-CM | POA: Diagnosis not present

## 2015-08-30 DIAGNOSIS — D649 Anemia, unspecified: Secondary | ICD-10-CM | POA: Diagnosis not present

## 2015-08-30 DIAGNOSIS — M810 Age-related osteoporosis without current pathological fracture: Secondary | ICD-10-CM | POA: Diagnosis not present

## 2015-08-30 DIAGNOSIS — I10 Essential (primary) hypertension: Secondary | ICD-10-CM | POA: Diagnosis not present

## 2015-08-30 DIAGNOSIS — F3110 Bipolar disorder, current episode manic without psychotic features, unspecified: Secondary | ICD-10-CM | POA: Diagnosis not present

## 2015-08-30 DIAGNOSIS — Z79899 Other long term (current) drug therapy: Secondary | ICD-10-CM | POA: Diagnosis not present

## 2015-09-06 DIAGNOSIS — I10 Essential (primary) hypertension: Secondary | ICD-10-CM | POA: Diagnosis not present

## 2015-09-06 DIAGNOSIS — F0390 Unspecified dementia without behavioral disturbance: Secondary | ICD-10-CM | POA: Diagnosis not present

## 2015-09-06 DIAGNOSIS — R2681 Unsteadiness on feet: Secondary | ICD-10-CM | POA: Diagnosis not present

## 2015-09-06 DIAGNOSIS — F319 Bipolar disorder, unspecified: Secondary | ICD-10-CM | POA: Diagnosis not present

## 2015-09-06 DIAGNOSIS — M6281 Muscle weakness (generalized): Secondary | ICD-10-CM | POA: Diagnosis not present

## 2015-09-06 DIAGNOSIS — Z742 Need for assistance at home and no other household member able to render care: Secondary | ICD-10-CM | POA: Diagnosis not present

## 2015-09-07 DIAGNOSIS — R54 Age-related physical debility: Secondary | ICD-10-CM | POA: Diagnosis not present

## 2015-09-07 DIAGNOSIS — J309 Allergic rhinitis, unspecified: Secondary | ICD-10-CM | POA: Diagnosis not present

## 2015-09-07 DIAGNOSIS — R05 Cough: Secondary | ICD-10-CM | POA: Diagnosis not present

## 2015-09-12 DIAGNOSIS — F319 Bipolar disorder, unspecified: Secondary | ICD-10-CM | POA: Diagnosis not present

## 2015-09-12 DIAGNOSIS — I10 Essential (primary) hypertension: Secondary | ICD-10-CM | POA: Diagnosis not present

## 2015-09-12 DIAGNOSIS — M6281 Muscle weakness (generalized): Secondary | ICD-10-CM | POA: Diagnosis not present

## 2015-09-12 DIAGNOSIS — R2681 Unsteadiness on feet: Secondary | ICD-10-CM | POA: Diagnosis not present

## 2015-09-12 DIAGNOSIS — F0390 Unspecified dementia without behavioral disturbance: Secondary | ICD-10-CM | POA: Diagnosis not present

## 2015-09-12 DIAGNOSIS — Z742 Need for assistance at home and no other household member able to render care: Secondary | ICD-10-CM | POA: Diagnosis not present

## 2015-09-19 DIAGNOSIS — F0390 Unspecified dementia without behavioral disturbance: Secondary | ICD-10-CM | POA: Diagnosis not present

## 2015-09-19 DIAGNOSIS — I10 Essential (primary) hypertension: Secondary | ICD-10-CM | POA: Diagnosis not present

## 2015-09-19 DIAGNOSIS — R2681 Unsteadiness on feet: Secondary | ICD-10-CM | POA: Diagnosis not present

## 2015-09-19 DIAGNOSIS — Z742 Need for assistance at home and no other household member able to render care: Secondary | ICD-10-CM | POA: Diagnosis not present

## 2015-09-19 DIAGNOSIS — F319 Bipolar disorder, unspecified: Secondary | ICD-10-CM | POA: Diagnosis not present

## 2015-09-19 DIAGNOSIS — M6281 Muscle weakness (generalized): Secondary | ICD-10-CM | POA: Diagnosis not present

## 2015-09-21 DIAGNOSIS — F0390 Unspecified dementia without behavioral disturbance: Secondary | ICD-10-CM | POA: Diagnosis not present

## 2015-09-21 DIAGNOSIS — F319 Bipolar disorder, unspecified: Secondary | ICD-10-CM | POA: Diagnosis not present

## 2015-09-21 DIAGNOSIS — M6281 Muscle weakness (generalized): Secondary | ICD-10-CM | POA: Diagnosis not present

## 2015-09-21 DIAGNOSIS — I10 Essential (primary) hypertension: Secondary | ICD-10-CM | POA: Diagnosis not present

## 2015-09-21 DIAGNOSIS — Z9181 History of falling: Secondary | ICD-10-CM | POA: Diagnosis not present

## 2015-09-21 DIAGNOSIS — Z79891 Long term (current) use of opiate analgesic: Secondary | ICD-10-CM | POA: Diagnosis not present

## 2015-09-21 DIAGNOSIS — Z742 Need for assistance at home and no other household member able to render care: Secondary | ICD-10-CM | POA: Diagnosis not present

## 2015-09-21 DIAGNOSIS — R2681 Unsteadiness on feet: Secondary | ICD-10-CM | POA: Diagnosis not present

## 2015-09-25 DIAGNOSIS — M6281 Muscle weakness (generalized): Secondary | ICD-10-CM | POA: Diagnosis not present

## 2015-09-25 DIAGNOSIS — F319 Bipolar disorder, unspecified: Secondary | ICD-10-CM | POA: Diagnosis not present

## 2015-09-25 DIAGNOSIS — R2681 Unsteadiness on feet: Secondary | ICD-10-CM | POA: Diagnosis not present

## 2015-09-25 DIAGNOSIS — Z742 Need for assistance at home and no other household member able to render care: Secondary | ICD-10-CM | POA: Diagnosis not present

## 2015-09-25 DIAGNOSIS — F0390 Unspecified dementia without behavioral disturbance: Secondary | ICD-10-CM | POA: Diagnosis not present

## 2015-09-25 DIAGNOSIS — I10 Essential (primary) hypertension: Secondary | ICD-10-CM | POA: Diagnosis not present

## 2015-09-28 DIAGNOSIS — R54 Age-related physical debility: Secondary | ICD-10-CM | POA: Diagnosis not present

## 2015-09-28 DIAGNOSIS — R0989 Other specified symptoms and signs involving the circulatory and respiratory systems: Secondary | ICD-10-CM | POA: Diagnosis not present

## 2015-09-28 DIAGNOSIS — R1310 Dysphagia, unspecified: Secondary | ICD-10-CM | POA: Diagnosis not present

## 2015-09-28 DIAGNOSIS — R4182 Altered mental status, unspecified: Secondary | ICD-10-CM | POA: Diagnosis not present

## 2015-10-02 DIAGNOSIS — F319 Bipolar disorder, unspecified: Secondary | ICD-10-CM | POA: Diagnosis not present

## 2015-10-02 DIAGNOSIS — R2681 Unsteadiness on feet: Secondary | ICD-10-CM | POA: Diagnosis not present

## 2015-10-02 DIAGNOSIS — I10 Essential (primary) hypertension: Secondary | ICD-10-CM | POA: Diagnosis not present

## 2015-10-02 DIAGNOSIS — F0390 Unspecified dementia without behavioral disturbance: Secondary | ICD-10-CM | POA: Diagnosis not present

## 2015-10-02 DIAGNOSIS — Z742 Need for assistance at home and no other household member able to render care: Secondary | ICD-10-CM | POA: Diagnosis not present

## 2015-10-02 DIAGNOSIS — M6281 Muscle weakness (generalized): Secondary | ICD-10-CM | POA: Diagnosis not present

## 2015-10-03 DIAGNOSIS — Z79899 Other long term (current) drug therapy: Secondary | ICD-10-CM | POA: Diagnosis not present

## 2015-10-03 DIAGNOSIS — F319 Bipolar disorder, unspecified: Secondary | ICD-10-CM | POA: Diagnosis not present

## 2015-10-03 DIAGNOSIS — F0390 Unspecified dementia without behavioral disturbance: Secondary | ICD-10-CM | POA: Diagnosis not present

## 2015-10-03 DIAGNOSIS — M6281 Muscle weakness (generalized): Secondary | ICD-10-CM | POA: Diagnosis not present

## 2015-10-03 DIAGNOSIS — Z742 Need for assistance at home and no other household member able to render care: Secondary | ICD-10-CM | POA: Diagnosis not present

## 2015-10-03 DIAGNOSIS — R2681 Unsteadiness on feet: Secondary | ICD-10-CM | POA: Diagnosis not present

## 2015-10-03 DIAGNOSIS — I10 Essential (primary) hypertension: Secondary | ICD-10-CM | POA: Diagnosis not present

## 2015-10-04 DIAGNOSIS — I10 Essential (primary) hypertension: Secondary | ICD-10-CM | POA: Diagnosis not present

## 2015-10-04 DIAGNOSIS — M6281 Muscle weakness (generalized): Secondary | ICD-10-CM | POA: Diagnosis not present

## 2015-10-04 DIAGNOSIS — F319 Bipolar disorder, unspecified: Secondary | ICD-10-CM | POA: Diagnosis not present

## 2015-10-04 DIAGNOSIS — Z742 Need for assistance at home and no other household member able to render care: Secondary | ICD-10-CM | POA: Diagnosis not present

## 2015-10-04 DIAGNOSIS — R2681 Unsteadiness on feet: Secondary | ICD-10-CM | POA: Diagnosis not present

## 2015-10-04 DIAGNOSIS — F0390 Unspecified dementia without behavioral disturbance: Secondary | ICD-10-CM | POA: Diagnosis not present

## 2015-10-05 DIAGNOSIS — R05 Cough: Secondary | ICD-10-CM | POA: Diagnosis not present

## 2015-10-05 DIAGNOSIS — J309 Allergic rhinitis, unspecified: Secondary | ICD-10-CM | POA: Diagnosis not present

## 2015-10-05 DIAGNOSIS — R54 Age-related physical debility: Secondary | ICD-10-CM | POA: Diagnosis not present

## 2015-10-06 DIAGNOSIS — R05 Cough: Secondary | ICD-10-CM | POA: Diagnosis not present

## 2015-10-06 DIAGNOSIS — R918 Other nonspecific abnormal finding of lung field: Secondary | ICD-10-CM | POA: Diagnosis not present

## 2015-10-08 DIAGNOSIS — M6281 Muscle weakness (generalized): Secondary | ICD-10-CM | POA: Diagnosis not present

## 2015-10-08 DIAGNOSIS — R2681 Unsteadiness on feet: Secondary | ICD-10-CM | POA: Diagnosis not present

## 2015-10-08 DIAGNOSIS — Z742 Need for assistance at home and no other household member able to render care: Secondary | ICD-10-CM | POA: Diagnosis not present

## 2015-10-08 DIAGNOSIS — I10 Essential (primary) hypertension: Secondary | ICD-10-CM | POA: Diagnosis not present

## 2015-10-08 DIAGNOSIS — F0390 Unspecified dementia without behavioral disturbance: Secondary | ICD-10-CM | POA: Diagnosis not present

## 2015-10-08 DIAGNOSIS — F319 Bipolar disorder, unspecified: Secondary | ICD-10-CM | POA: Diagnosis not present

## 2015-10-09 DIAGNOSIS — M6281 Muscle weakness (generalized): Secondary | ICD-10-CM | POA: Diagnosis not present

## 2015-10-09 DIAGNOSIS — F0390 Unspecified dementia without behavioral disturbance: Secondary | ICD-10-CM | POA: Diagnosis not present

## 2015-10-09 DIAGNOSIS — I10 Essential (primary) hypertension: Secondary | ICD-10-CM | POA: Diagnosis not present

## 2015-10-09 DIAGNOSIS — R2681 Unsteadiness on feet: Secondary | ICD-10-CM | POA: Diagnosis not present

## 2015-10-09 DIAGNOSIS — Z742 Need for assistance at home and no other household member able to render care: Secondary | ICD-10-CM | POA: Diagnosis not present

## 2015-10-09 DIAGNOSIS — F319 Bipolar disorder, unspecified: Secondary | ICD-10-CM | POA: Diagnosis not present

## 2015-10-11 DIAGNOSIS — F319 Bipolar disorder, unspecified: Secondary | ICD-10-CM | POA: Diagnosis not present

## 2015-10-11 DIAGNOSIS — Z742 Need for assistance at home and no other household member able to render care: Secondary | ICD-10-CM | POA: Diagnosis not present

## 2015-10-11 DIAGNOSIS — I10 Essential (primary) hypertension: Secondary | ICD-10-CM | POA: Diagnosis not present

## 2015-10-11 DIAGNOSIS — R2681 Unsteadiness on feet: Secondary | ICD-10-CM | POA: Diagnosis not present

## 2015-10-11 DIAGNOSIS — F0390 Unspecified dementia without behavioral disturbance: Secondary | ICD-10-CM | POA: Diagnosis not present

## 2015-10-11 DIAGNOSIS — M6281 Muscle weakness (generalized): Secondary | ICD-10-CM | POA: Diagnosis not present

## 2015-10-12 DIAGNOSIS — F319 Bipolar disorder, unspecified: Secondary | ICD-10-CM | POA: Diagnosis not present

## 2015-10-12 DIAGNOSIS — R54 Age-related physical debility: Secondary | ICD-10-CM | POA: Diagnosis not present

## 2015-10-12 DIAGNOSIS — R0989 Other specified symptoms and signs involving the circulatory and respiratory systems: Secondary | ICD-10-CM | POA: Diagnosis not present

## 2015-10-12 DIAGNOSIS — I10 Essential (primary) hypertension: Secondary | ICD-10-CM | POA: Diagnosis not present

## 2015-10-12 DIAGNOSIS — Z742 Need for assistance at home and no other household member able to render care: Secondary | ICD-10-CM | POA: Diagnosis not present

## 2015-10-12 DIAGNOSIS — F0390 Unspecified dementia without behavioral disturbance: Secondary | ICD-10-CM | POA: Diagnosis not present

## 2015-10-12 DIAGNOSIS — R05 Cough: Secondary | ICD-10-CM | POA: Diagnosis not present

## 2015-10-12 DIAGNOSIS — J189 Pneumonia, unspecified organism: Secondary | ICD-10-CM | POA: Diagnosis not present

## 2015-10-12 DIAGNOSIS — M6281 Muscle weakness (generalized): Secondary | ICD-10-CM | POA: Diagnosis not present

## 2015-10-12 DIAGNOSIS — R2681 Unsteadiness on feet: Secondary | ICD-10-CM | POA: Diagnosis not present

## 2015-10-19 DIAGNOSIS — M6281 Muscle weakness (generalized): Secondary | ICD-10-CM | POA: Diagnosis not present

## 2015-10-19 DIAGNOSIS — R2681 Unsteadiness on feet: Secondary | ICD-10-CM | POA: Diagnosis not present

## 2015-10-19 DIAGNOSIS — F0390 Unspecified dementia without behavioral disturbance: Secondary | ICD-10-CM | POA: Diagnosis not present

## 2015-10-19 DIAGNOSIS — F319 Bipolar disorder, unspecified: Secondary | ICD-10-CM | POA: Diagnosis not present

## 2015-10-19 DIAGNOSIS — I10 Essential (primary) hypertension: Secondary | ICD-10-CM | POA: Diagnosis not present

## 2015-10-19 DIAGNOSIS — Z742 Need for assistance at home and no other household member able to render care: Secondary | ICD-10-CM | POA: Diagnosis not present

## 2015-10-23 DIAGNOSIS — R2681 Unsteadiness on feet: Secondary | ICD-10-CM | POA: Diagnosis not present

## 2015-10-23 DIAGNOSIS — Z742 Need for assistance at home and no other household member able to render care: Secondary | ICD-10-CM | POA: Diagnosis not present

## 2015-10-23 DIAGNOSIS — I10 Essential (primary) hypertension: Secondary | ICD-10-CM | POA: Diagnosis not present

## 2015-10-23 DIAGNOSIS — M6281 Muscle weakness (generalized): Secondary | ICD-10-CM | POA: Diagnosis not present

## 2015-10-23 DIAGNOSIS — F319 Bipolar disorder, unspecified: Secondary | ICD-10-CM | POA: Diagnosis not present

## 2015-10-23 DIAGNOSIS — F0390 Unspecified dementia without behavioral disturbance: Secondary | ICD-10-CM | POA: Diagnosis not present

## 2015-10-25 DIAGNOSIS — Z742 Need for assistance at home and no other household member able to render care: Secondary | ICD-10-CM | POA: Diagnosis not present

## 2015-10-25 DIAGNOSIS — I10 Essential (primary) hypertension: Secondary | ICD-10-CM | POA: Diagnosis not present

## 2015-10-25 DIAGNOSIS — F0390 Unspecified dementia without behavioral disturbance: Secondary | ICD-10-CM | POA: Diagnosis not present

## 2015-10-25 DIAGNOSIS — F319 Bipolar disorder, unspecified: Secondary | ICD-10-CM | POA: Diagnosis not present

## 2015-10-25 DIAGNOSIS — M6281 Muscle weakness (generalized): Secondary | ICD-10-CM | POA: Diagnosis not present

## 2015-10-25 DIAGNOSIS — R2681 Unsteadiness on feet: Secondary | ICD-10-CM | POA: Diagnosis not present

## 2015-10-26 DIAGNOSIS — Z742 Need for assistance at home and no other household member able to render care: Secondary | ICD-10-CM | POA: Diagnosis not present

## 2015-10-26 DIAGNOSIS — M6281 Muscle weakness (generalized): Secondary | ICD-10-CM | POA: Diagnosis not present

## 2015-10-26 DIAGNOSIS — R2681 Unsteadiness on feet: Secondary | ICD-10-CM | POA: Diagnosis not present

## 2015-10-26 DIAGNOSIS — F319 Bipolar disorder, unspecified: Secondary | ICD-10-CM | POA: Diagnosis not present

## 2015-10-26 DIAGNOSIS — F0390 Unspecified dementia without behavioral disturbance: Secondary | ICD-10-CM | POA: Diagnosis not present

## 2015-10-26 DIAGNOSIS — I10 Essential (primary) hypertension: Secondary | ICD-10-CM | POA: Diagnosis not present

## 2015-10-27 DIAGNOSIS — F319 Bipolar disorder, unspecified: Secondary | ICD-10-CM | POA: Diagnosis not present

## 2015-10-27 DIAGNOSIS — I10 Essential (primary) hypertension: Secondary | ICD-10-CM | POA: Diagnosis not present

## 2015-10-27 DIAGNOSIS — F0390 Unspecified dementia without behavioral disturbance: Secondary | ICD-10-CM | POA: Diagnosis not present

## 2015-10-27 DIAGNOSIS — M6281 Muscle weakness (generalized): Secondary | ICD-10-CM | POA: Diagnosis not present

## 2015-10-27 DIAGNOSIS — Z742 Need for assistance at home and no other household member able to render care: Secondary | ICD-10-CM | POA: Diagnosis not present

## 2015-10-27 DIAGNOSIS — R2681 Unsteadiness on feet: Secondary | ICD-10-CM | POA: Diagnosis not present

## 2015-10-30 DIAGNOSIS — R2681 Unsteadiness on feet: Secondary | ICD-10-CM | POA: Diagnosis not present

## 2015-10-30 DIAGNOSIS — F0390 Unspecified dementia without behavioral disturbance: Secondary | ICD-10-CM | POA: Diagnosis not present

## 2015-10-30 DIAGNOSIS — Z742 Need for assistance at home and no other household member able to render care: Secondary | ICD-10-CM | POA: Diagnosis not present

## 2015-10-30 DIAGNOSIS — I10 Essential (primary) hypertension: Secondary | ICD-10-CM | POA: Diagnosis not present

## 2015-10-30 DIAGNOSIS — F319 Bipolar disorder, unspecified: Secondary | ICD-10-CM | POA: Diagnosis not present

## 2015-10-30 DIAGNOSIS — M6281 Muscle weakness (generalized): Secondary | ICD-10-CM | POA: Diagnosis not present

## 2015-10-31 DIAGNOSIS — R2681 Unsteadiness on feet: Secondary | ICD-10-CM | POA: Diagnosis not present

## 2015-10-31 DIAGNOSIS — I10 Essential (primary) hypertension: Secondary | ICD-10-CM | POA: Diagnosis not present

## 2015-10-31 DIAGNOSIS — M6281 Muscle weakness (generalized): Secondary | ICD-10-CM | POA: Diagnosis not present

## 2015-10-31 DIAGNOSIS — F319 Bipolar disorder, unspecified: Secondary | ICD-10-CM | POA: Diagnosis not present

## 2015-10-31 DIAGNOSIS — Z742 Need for assistance at home and no other household member able to render care: Secondary | ICD-10-CM | POA: Diagnosis not present

## 2015-10-31 DIAGNOSIS — F0390 Unspecified dementia without behavioral disturbance: Secondary | ICD-10-CM | POA: Diagnosis not present

## 2015-11-01 DIAGNOSIS — Z742 Need for assistance at home and no other household member able to render care: Secondary | ICD-10-CM | POA: Diagnosis not present

## 2015-11-01 DIAGNOSIS — R2681 Unsteadiness on feet: Secondary | ICD-10-CM | POA: Diagnosis not present

## 2015-11-01 DIAGNOSIS — F319 Bipolar disorder, unspecified: Secondary | ICD-10-CM | POA: Diagnosis not present

## 2015-11-01 DIAGNOSIS — M6281 Muscle weakness (generalized): Secondary | ICD-10-CM | POA: Diagnosis not present

## 2015-11-01 DIAGNOSIS — I10 Essential (primary) hypertension: Secondary | ICD-10-CM | POA: Diagnosis not present

## 2015-11-01 DIAGNOSIS — F0390 Unspecified dementia without behavioral disturbance: Secondary | ICD-10-CM | POA: Diagnosis not present

## 2015-11-02 DIAGNOSIS — Z742 Need for assistance at home and no other household member able to render care: Secondary | ICD-10-CM | POA: Diagnosis not present

## 2015-11-02 DIAGNOSIS — F319 Bipolar disorder, unspecified: Secondary | ICD-10-CM | POA: Diagnosis not present

## 2015-11-02 DIAGNOSIS — I10 Essential (primary) hypertension: Secondary | ICD-10-CM | POA: Diagnosis not present

## 2015-11-02 DIAGNOSIS — M6281 Muscle weakness (generalized): Secondary | ICD-10-CM | POA: Diagnosis not present

## 2015-11-02 DIAGNOSIS — F0390 Unspecified dementia without behavioral disturbance: Secondary | ICD-10-CM | POA: Diagnosis not present

## 2015-11-02 DIAGNOSIS — R2681 Unsteadiness on feet: Secondary | ICD-10-CM | POA: Diagnosis not present

## 2015-11-06 DIAGNOSIS — R2681 Unsteadiness on feet: Secondary | ICD-10-CM | POA: Diagnosis not present

## 2015-11-06 DIAGNOSIS — F319 Bipolar disorder, unspecified: Secondary | ICD-10-CM | POA: Diagnosis not present

## 2015-11-06 DIAGNOSIS — M6281 Muscle weakness (generalized): Secondary | ICD-10-CM | POA: Diagnosis not present

## 2015-11-06 DIAGNOSIS — Z742 Need for assistance at home and no other household member able to render care: Secondary | ICD-10-CM | POA: Diagnosis not present

## 2015-11-06 DIAGNOSIS — I10 Essential (primary) hypertension: Secondary | ICD-10-CM | POA: Diagnosis not present

## 2015-11-06 DIAGNOSIS — F0390 Unspecified dementia without behavioral disturbance: Secondary | ICD-10-CM | POA: Diagnosis not present

## 2015-11-07 DIAGNOSIS — M6281 Muscle weakness (generalized): Secondary | ICD-10-CM | POA: Diagnosis not present

## 2015-11-07 DIAGNOSIS — R2681 Unsteadiness on feet: Secondary | ICD-10-CM | POA: Diagnosis not present

## 2015-11-07 DIAGNOSIS — I10 Essential (primary) hypertension: Secondary | ICD-10-CM | POA: Diagnosis not present

## 2015-11-07 DIAGNOSIS — F0390 Unspecified dementia without behavioral disturbance: Secondary | ICD-10-CM | POA: Diagnosis not present

## 2015-11-07 DIAGNOSIS — F319 Bipolar disorder, unspecified: Secondary | ICD-10-CM | POA: Diagnosis not present

## 2015-11-07 DIAGNOSIS — Z742 Need for assistance at home and no other household member able to render care: Secondary | ICD-10-CM | POA: Diagnosis not present

## 2015-11-09 DIAGNOSIS — Z742 Need for assistance at home and no other household member able to render care: Secondary | ICD-10-CM | POA: Diagnosis not present

## 2015-11-09 DIAGNOSIS — R2681 Unsteadiness on feet: Secondary | ICD-10-CM | POA: Diagnosis not present

## 2015-11-09 DIAGNOSIS — I10 Essential (primary) hypertension: Secondary | ICD-10-CM | POA: Diagnosis not present

## 2015-11-09 DIAGNOSIS — M6281 Muscle weakness (generalized): Secondary | ICD-10-CM | POA: Diagnosis not present

## 2015-11-09 DIAGNOSIS — F0390 Unspecified dementia without behavioral disturbance: Secondary | ICD-10-CM | POA: Diagnosis not present

## 2015-11-09 DIAGNOSIS — F319 Bipolar disorder, unspecified: Secondary | ICD-10-CM | POA: Diagnosis not present

## 2015-11-13 DIAGNOSIS — F0390 Unspecified dementia without behavioral disturbance: Secondary | ICD-10-CM | POA: Diagnosis not present

## 2015-11-13 DIAGNOSIS — M6281 Muscle weakness (generalized): Secondary | ICD-10-CM | POA: Diagnosis not present

## 2015-11-13 DIAGNOSIS — Z742 Need for assistance at home and no other household member able to render care: Secondary | ICD-10-CM | POA: Diagnosis not present

## 2015-11-13 DIAGNOSIS — F319 Bipolar disorder, unspecified: Secondary | ICD-10-CM | POA: Diagnosis not present

## 2015-11-13 DIAGNOSIS — I10 Essential (primary) hypertension: Secondary | ICD-10-CM | POA: Diagnosis not present

## 2015-11-13 DIAGNOSIS — R2681 Unsteadiness on feet: Secondary | ICD-10-CM | POA: Diagnosis not present

## 2015-11-14 DIAGNOSIS — M79671 Pain in right foot: Secondary | ICD-10-CM | POA: Diagnosis not present

## 2015-11-14 DIAGNOSIS — B351 Tinea unguium: Secondary | ICD-10-CM | POA: Diagnosis not present

## 2015-11-15 DIAGNOSIS — R2681 Unsteadiness on feet: Secondary | ICD-10-CM | POA: Diagnosis not present

## 2015-11-15 DIAGNOSIS — I10 Essential (primary) hypertension: Secondary | ICD-10-CM | POA: Diagnosis not present

## 2015-11-15 DIAGNOSIS — F319 Bipolar disorder, unspecified: Secondary | ICD-10-CM | POA: Diagnosis not present

## 2015-11-15 DIAGNOSIS — M6281 Muscle weakness (generalized): Secondary | ICD-10-CM | POA: Diagnosis not present

## 2015-11-15 DIAGNOSIS — F0390 Unspecified dementia without behavioral disturbance: Secondary | ICD-10-CM | POA: Diagnosis not present

## 2015-11-15 DIAGNOSIS — Z742 Need for assistance at home and no other household member able to render care: Secondary | ICD-10-CM | POA: Diagnosis not present

## 2015-11-16 DIAGNOSIS — Z742 Need for assistance at home and no other household member able to render care: Secondary | ICD-10-CM | POA: Diagnosis not present

## 2015-11-16 DIAGNOSIS — F319 Bipolar disorder, unspecified: Secondary | ICD-10-CM | POA: Diagnosis not present

## 2015-11-16 DIAGNOSIS — R2681 Unsteadiness on feet: Secondary | ICD-10-CM | POA: Diagnosis not present

## 2015-11-16 DIAGNOSIS — F0390 Unspecified dementia without behavioral disturbance: Secondary | ICD-10-CM | POA: Diagnosis not present

## 2015-11-16 DIAGNOSIS — M6281 Muscle weakness (generalized): Secondary | ICD-10-CM | POA: Diagnosis not present

## 2015-11-16 DIAGNOSIS — I10 Essential (primary) hypertension: Secondary | ICD-10-CM | POA: Diagnosis not present

## 2015-11-20 DIAGNOSIS — Z9181 History of falling: Secondary | ICD-10-CM | POA: Diagnosis not present

## 2015-11-20 DIAGNOSIS — Z79891 Long term (current) use of opiate analgesic: Secondary | ICD-10-CM | POA: Diagnosis not present

## 2015-11-20 DIAGNOSIS — M6281 Muscle weakness (generalized): Secondary | ICD-10-CM | POA: Diagnosis not present

## 2015-11-20 DIAGNOSIS — R2681 Unsteadiness on feet: Secondary | ICD-10-CM | POA: Diagnosis not present

## 2015-11-20 DIAGNOSIS — F0390 Unspecified dementia without behavioral disturbance: Secondary | ICD-10-CM | POA: Diagnosis not present

## 2015-11-20 DIAGNOSIS — F319 Bipolar disorder, unspecified: Secondary | ICD-10-CM | POA: Diagnosis not present

## 2015-11-20 DIAGNOSIS — I10 Essential (primary) hypertension: Secondary | ICD-10-CM | POA: Diagnosis not present

## 2015-11-20 DIAGNOSIS — Z742 Need for assistance at home and no other household member able to render care: Secondary | ICD-10-CM | POA: Diagnosis not present

## 2015-11-21 DIAGNOSIS — F0390 Unspecified dementia without behavioral disturbance: Secondary | ICD-10-CM | POA: Diagnosis not present

## 2015-11-21 DIAGNOSIS — Z742 Need for assistance at home and no other household member able to render care: Secondary | ICD-10-CM | POA: Diagnosis not present

## 2015-11-21 DIAGNOSIS — F319 Bipolar disorder, unspecified: Secondary | ICD-10-CM | POA: Diagnosis not present

## 2015-11-21 DIAGNOSIS — M6281 Muscle weakness (generalized): Secondary | ICD-10-CM | POA: Diagnosis not present

## 2015-11-21 DIAGNOSIS — I10 Essential (primary) hypertension: Secondary | ICD-10-CM | POA: Diagnosis not present

## 2015-11-21 DIAGNOSIS — R2681 Unsteadiness on feet: Secondary | ICD-10-CM | POA: Diagnosis not present

## 2015-11-22 DIAGNOSIS — R54 Age-related physical debility: Secondary | ICD-10-CM | POA: Diagnosis not present

## 2015-11-22 DIAGNOSIS — I10 Essential (primary) hypertension: Secondary | ICD-10-CM | POA: Diagnosis not present

## 2015-11-24 DIAGNOSIS — R2681 Unsteadiness on feet: Secondary | ICD-10-CM | POA: Diagnosis not present

## 2015-11-24 DIAGNOSIS — F0390 Unspecified dementia without behavioral disturbance: Secondary | ICD-10-CM | POA: Diagnosis not present

## 2015-11-24 DIAGNOSIS — I10 Essential (primary) hypertension: Secondary | ICD-10-CM | POA: Diagnosis not present

## 2015-11-24 DIAGNOSIS — M6281 Muscle weakness (generalized): Secondary | ICD-10-CM | POA: Diagnosis not present

## 2015-11-24 DIAGNOSIS — F319 Bipolar disorder, unspecified: Secondary | ICD-10-CM | POA: Diagnosis not present

## 2015-11-24 DIAGNOSIS — Z742 Need for assistance at home and no other household member able to render care: Secondary | ICD-10-CM | POA: Diagnosis not present

## 2015-11-28 DIAGNOSIS — Z742 Need for assistance at home and no other household member able to render care: Secondary | ICD-10-CM | POA: Diagnosis not present

## 2015-11-28 DIAGNOSIS — M6281 Muscle weakness (generalized): Secondary | ICD-10-CM | POA: Diagnosis not present

## 2015-11-28 DIAGNOSIS — I10 Essential (primary) hypertension: Secondary | ICD-10-CM | POA: Diagnosis not present

## 2015-11-28 DIAGNOSIS — F319 Bipolar disorder, unspecified: Secondary | ICD-10-CM | POA: Diagnosis not present

## 2015-11-28 DIAGNOSIS — R2681 Unsteadiness on feet: Secondary | ICD-10-CM | POA: Diagnosis not present

## 2015-11-28 DIAGNOSIS — F0390 Unspecified dementia without behavioral disturbance: Secondary | ICD-10-CM | POA: Diagnosis not present

## 2015-12-02 DIAGNOSIS — R2681 Unsteadiness on feet: Secondary | ICD-10-CM | POA: Diagnosis not present

## 2015-12-02 DIAGNOSIS — F0390 Unspecified dementia without behavioral disturbance: Secondary | ICD-10-CM | POA: Diagnosis not present

## 2015-12-02 DIAGNOSIS — I10 Essential (primary) hypertension: Secondary | ICD-10-CM | POA: Diagnosis not present

## 2015-12-02 DIAGNOSIS — M6281 Muscle weakness (generalized): Secondary | ICD-10-CM | POA: Diagnosis not present

## 2015-12-02 DIAGNOSIS — Z742 Need for assistance at home and no other household member able to render care: Secondary | ICD-10-CM | POA: Diagnosis not present

## 2015-12-02 DIAGNOSIS — F319 Bipolar disorder, unspecified: Secondary | ICD-10-CM | POA: Diagnosis not present

## 2015-12-04 DIAGNOSIS — F0390 Unspecified dementia without behavioral disturbance: Secondary | ICD-10-CM | POA: Diagnosis not present

## 2015-12-04 DIAGNOSIS — R2681 Unsteadiness on feet: Secondary | ICD-10-CM | POA: Diagnosis not present

## 2015-12-04 DIAGNOSIS — M6281 Muscle weakness (generalized): Secondary | ICD-10-CM | POA: Diagnosis not present

## 2015-12-04 DIAGNOSIS — Z742 Need for assistance at home and no other household member able to render care: Secondary | ICD-10-CM | POA: Diagnosis not present

## 2015-12-04 DIAGNOSIS — F319 Bipolar disorder, unspecified: Secondary | ICD-10-CM | POA: Diagnosis not present

## 2015-12-04 DIAGNOSIS — I10 Essential (primary) hypertension: Secondary | ICD-10-CM | POA: Diagnosis not present

## 2015-12-06 DIAGNOSIS — F319 Bipolar disorder, unspecified: Secondary | ICD-10-CM | POA: Diagnosis not present

## 2015-12-06 DIAGNOSIS — Z742 Need for assistance at home and no other household member able to render care: Secondary | ICD-10-CM | POA: Diagnosis not present

## 2015-12-06 DIAGNOSIS — M6281 Muscle weakness (generalized): Secondary | ICD-10-CM | POA: Diagnosis not present

## 2015-12-06 DIAGNOSIS — I10 Essential (primary) hypertension: Secondary | ICD-10-CM | POA: Diagnosis not present

## 2015-12-06 DIAGNOSIS — F0390 Unspecified dementia without behavioral disturbance: Secondary | ICD-10-CM | POA: Diagnosis not present

## 2015-12-06 DIAGNOSIS — R2681 Unsteadiness on feet: Secondary | ICD-10-CM | POA: Diagnosis not present

## 2015-12-12 DIAGNOSIS — M6281 Muscle weakness (generalized): Secondary | ICD-10-CM | POA: Diagnosis not present

## 2015-12-12 DIAGNOSIS — R2681 Unsteadiness on feet: Secondary | ICD-10-CM | POA: Diagnosis not present

## 2015-12-12 DIAGNOSIS — F0390 Unspecified dementia without behavioral disturbance: Secondary | ICD-10-CM | POA: Diagnosis not present

## 2015-12-12 DIAGNOSIS — F319 Bipolar disorder, unspecified: Secondary | ICD-10-CM | POA: Diagnosis not present

## 2015-12-12 DIAGNOSIS — I10 Essential (primary) hypertension: Secondary | ICD-10-CM | POA: Diagnosis not present

## 2015-12-12 DIAGNOSIS — Z742 Need for assistance at home and no other household member able to render care: Secondary | ICD-10-CM | POA: Diagnosis not present

## 2015-12-13 DIAGNOSIS — Z742 Need for assistance at home and no other household member able to render care: Secondary | ICD-10-CM | POA: Diagnosis not present

## 2015-12-13 DIAGNOSIS — R2681 Unsteadiness on feet: Secondary | ICD-10-CM | POA: Diagnosis not present

## 2015-12-13 DIAGNOSIS — F0390 Unspecified dementia without behavioral disturbance: Secondary | ICD-10-CM | POA: Diagnosis not present

## 2015-12-13 DIAGNOSIS — F319 Bipolar disorder, unspecified: Secondary | ICD-10-CM | POA: Diagnosis not present

## 2015-12-13 DIAGNOSIS — I10 Essential (primary) hypertension: Secondary | ICD-10-CM | POA: Diagnosis not present

## 2015-12-13 DIAGNOSIS — M6281 Muscle weakness (generalized): Secondary | ICD-10-CM | POA: Diagnosis not present

## 2015-12-18 DIAGNOSIS — R2681 Unsteadiness on feet: Secondary | ICD-10-CM | POA: Diagnosis not present

## 2015-12-18 DIAGNOSIS — Z742 Need for assistance at home and no other household member able to render care: Secondary | ICD-10-CM | POA: Diagnosis not present

## 2015-12-18 DIAGNOSIS — F319 Bipolar disorder, unspecified: Secondary | ICD-10-CM | POA: Diagnosis not present

## 2015-12-18 DIAGNOSIS — I10 Essential (primary) hypertension: Secondary | ICD-10-CM | POA: Diagnosis not present

## 2015-12-18 DIAGNOSIS — F0390 Unspecified dementia without behavioral disturbance: Secondary | ICD-10-CM | POA: Diagnosis not present

## 2015-12-18 DIAGNOSIS — M6281 Muscle weakness (generalized): Secondary | ICD-10-CM | POA: Diagnosis not present

## 2015-12-27 DIAGNOSIS — R2681 Unsteadiness on feet: Secondary | ICD-10-CM | POA: Diagnosis not present

## 2015-12-27 DIAGNOSIS — F0390 Unspecified dementia without behavioral disturbance: Secondary | ICD-10-CM | POA: Diagnosis not present

## 2015-12-27 DIAGNOSIS — I10 Essential (primary) hypertension: Secondary | ICD-10-CM | POA: Diagnosis not present

## 2015-12-27 DIAGNOSIS — Z742 Need for assistance at home and no other household member able to render care: Secondary | ICD-10-CM | POA: Diagnosis not present

## 2015-12-27 DIAGNOSIS — F319 Bipolar disorder, unspecified: Secondary | ICD-10-CM | POA: Diagnosis not present

## 2015-12-27 DIAGNOSIS — M6281 Muscle weakness (generalized): Secondary | ICD-10-CM | POA: Diagnosis not present

## 2016-01-03 DIAGNOSIS — F319 Bipolar disorder, unspecified: Secondary | ICD-10-CM | POA: Diagnosis not present

## 2016-01-03 DIAGNOSIS — M6281 Muscle weakness (generalized): Secondary | ICD-10-CM | POA: Diagnosis not present

## 2016-01-03 DIAGNOSIS — I10 Essential (primary) hypertension: Secondary | ICD-10-CM | POA: Diagnosis not present

## 2016-01-03 DIAGNOSIS — R2681 Unsteadiness on feet: Secondary | ICD-10-CM | POA: Diagnosis not present

## 2016-01-03 DIAGNOSIS — F0390 Unspecified dementia without behavioral disturbance: Secondary | ICD-10-CM | POA: Diagnosis not present

## 2016-01-03 DIAGNOSIS — Z742 Need for assistance at home and no other household member able to render care: Secondary | ICD-10-CM | POA: Diagnosis not present

## 2016-01-04 DIAGNOSIS — H6123 Impacted cerumen, bilateral: Secondary | ICD-10-CM | POA: Diagnosis not present

## 2016-01-08 DIAGNOSIS — R2681 Unsteadiness on feet: Secondary | ICD-10-CM | POA: Diagnosis not present

## 2016-01-08 DIAGNOSIS — R05 Cough: Secondary | ICD-10-CM | POA: Diagnosis not present

## 2016-01-08 DIAGNOSIS — I10 Essential (primary) hypertension: Secondary | ICD-10-CM | POA: Diagnosis not present

## 2016-01-08 DIAGNOSIS — Z742 Need for assistance at home and no other household member able to render care: Secondary | ICD-10-CM | POA: Diagnosis not present

## 2016-01-08 DIAGNOSIS — M6281 Muscle weakness (generalized): Secondary | ICD-10-CM | POA: Diagnosis not present

## 2016-01-08 DIAGNOSIS — F319 Bipolar disorder, unspecified: Secondary | ICD-10-CM | POA: Diagnosis not present

## 2016-01-08 DIAGNOSIS — F0390 Unspecified dementia without behavioral disturbance: Secondary | ICD-10-CM | POA: Diagnosis not present

## 2016-01-11 DIAGNOSIS — R0989 Other specified symptoms and signs involving the circulatory and respiratory systems: Secondary | ICD-10-CM | POA: Diagnosis not present

## 2016-01-11 DIAGNOSIS — R062 Wheezing: Secondary | ICD-10-CM | POA: Diagnosis not present

## 2016-01-11 DIAGNOSIS — R54 Age-related physical debility: Secondary | ICD-10-CM | POA: Diagnosis not present

## 2016-01-11 DIAGNOSIS — R05 Cough: Secondary | ICD-10-CM | POA: Diagnosis not present

## 2016-01-11 DIAGNOSIS — Z8701 Personal history of pneumonia (recurrent): Secondary | ICD-10-CM | POA: Diagnosis not present

## 2016-01-11 DIAGNOSIS — J984 Other disorders of lung: Secondary | ICD-10-CM | POA: Diagnosis not present

## 2016-01-15 DIAGNOSIS — R2681 Unsteadiness on feet: Secondary | ICD-10-CM | POA: Diagnosis not present

## 2016-01-15 DIAGNOSIS — F0390 Unspecified dementia without behavioral disturbance: Secondary | ICD-10-CM | POA: Diagnosis not present

## 2016-01-15 DIAGNOSIS — I10 Essential (primary) hypertension: Secondary | ICD-10-CM | POA: Diagnosis not present

## 2016-01-15 DIAGNOSIS — F319 Bipolar disorder, unspecified: Secondary | ICD-10-CM | POA: Diagnosis not present

## 2016-01-15 DIAGNOSIS — M6281 Muscle weakness (generalized): Secondary | ICD-10-CM | POA: Diagnosis not present

## 2016-01-15 DIAGNOSIS — Z742 Need for assistance at home and no other household member able to render care: Secondary | ICD-10-CM | POA: Diagnosis not present

## 2016-01-18 DIAGNOSIS — R269 Unspecified abnormalities of gait and mobility: Secondary | ICD-10-CM | POA: Diagnosis not present

## 2016-01-18 DIAGNOSIS — W19XXXA Unspecified fall, initial encounter: Secondary | ICD-10-CM | POA: Diagnosis not present

## 2016-01-18 DIAGNOSIS — R54 Age-related physical debility: Secondary | ICD-10-CM | POA: Diagnosis not present

## 2016-01-18 DIAGNOSIS — M6281 Muscle weakness (generalized): Secondary | ICD-10-CM | POA: Diagnosis not present

## 2016-01-19 DIAGNOSIS — Z9181 History of falling: Secondary | ICD-10-CM | POA: Diagnosis not present

## 2016-01-19 DIAGNOSIS — I10 Essential (primary) hypertension: Secondary | ICD-10-CM | POA: Diagnosis not present

## 2016-01-19 DIAGNOSIS — F0390 Unspecified dementia without behavioral disturbance: Secondary | ICD-10-CM | POA: Diagnosis not present

## 2016-01-19 DIAGNOSIS — M6281 Muscle weakness (generalized): Secondary | ICD-10-CM | POA: Diagnosis not present

## 2016-01-19 DIAGNOSIS — F319 Bipolar disorder, unspecified: Secondary | ICD-10-CM | POA: Diagnosis not present

## 2016-01-19 DIAGNOSIS — R2681 Unsteadiness on feet: Secondary | ICD-10-CM | POA: Diagnosis not present

## 2016-01-19 DIAGNOSIS — Z742 Need for assistance at home and no other household member able to render care: Secondary | ICD-10-CM | POA: Diagnosis not present

## 2016-01-19 DIAGNOSIS — Z79891 Long term (current) use of opiate analgesic: Secondary | ICD-10-CM | POA: Diagnosis not present

## 2016-01-22 DIAGNOSIS — I10 Essential (primary) hypertension: Secondary | ICD-10-CM | POA: Diagnosis not present

## 2016-01-22 DIAGNOSIS — F0390 Unspecified dementia without behavioral disturbance: Secondary | ICD-10-CM | POA: Diagnosis not present

## 2016-01-22 DIAGNOSIS — Z742 Need for assistance at home and no other household member able to render care: Secondary | ICD-10-CM | POA: Diagnosis not present

## 2016-01-22 DIAGNOSIS — M6281 Muscle weakness (generalized): Secondary | ICD-10-CM | POA: Diagnosis not present

## 2016-01-22 DIAGNOSIS — R2681 Unsteadiness on feet: Secondary | ICD-10-CM | POA: Diagnosis not present

## 2016-01-22 DIAGNOSIS — F319 Bipolar disorder, unspecified: Secondary | ICD-10-CM | POA: Diagnosis not present

## 2016-01-23 DIAGNOSIS — J4531 Mild persistent asthma with (acute) exacerbation: Secondary | ICD-10-CM | POA: Diagnosis not present

## 2016-01-23 DIAGNOSIS — R05 Cough: Secondary | ICD-10-CM | POA: Diagnosis not present

## 2016-01-23 DIAGNOSIS — J301 Allergic rhinitis due to pollen: Secondary | ICD-10-CM | POA: Diagnosis not present

## 2016-01-25 DIAGNOSIS — S064X0A Epidural hemorrhage without loss of consciousness, initial encounter: Secondary | ICD-10-CM | POA: Diagnosis not present

## 2016-01-25 DIAGNOSIS — S0003XA Contusion of scalp, initial encounter: Secondary | ICD-10-CM | POA: Diagnosis not present

## 2016-01-25 DIAGNOSIS — S0990XA Unspecified injury of head, initial encounter: Secondary | ICD-10-CM | POA: Diagnosis not present

## 2016-01-30 DIAGNOSIS — I10 Essential (primary) hypertension: Secondary | ICD-10-CM | POA: Diagnosis not present

## 2016-01-30 DIAGNOSIS — R2681 Unsteadiness on feet: Secondary | ICD-10-CM | POA: Diagnosis not present

## 2016-01-30 DIAGNOSIS — Z742 Need for assistance at home and no other household member able to render care: Secondary | ICD-10-CM | POA: Diagnosis not present

## 2016-01-30 DIAGNOSIS — F319 Bipolar disorder, unspecified: Secondary | ICD-10-CM | POA: Diagnosis not present

## 2016-01-30 DIAGNOSIS — F0390 Unspecified dementia without behavioral disturbance: Secondary | ICD-10-CM | POA: Diagnosis not present

## 2016-01-30 DIAGNOSIS — M6281 Muscle weakness (generalized): Secondary | ICD-10-CM | POA: Diagnosis not present

## 2016-02-01 DIAGNOSIS — R54 Age-related physical debility: Secondary | ICD-10-CM | POA: Diagnosis not present

## 2016-02-01 DIAGNOSIS — R269 Unspecified abnormalities of gait and mobility: Secondary | ICD-10-CM | POA: Diagnosis not present

## 2016-02-01 DIAGNOSIS — G309 Alzheimer's disease, unspecified: Secondary | ICD-10-CM | POA: Diagnosis not present

## 2016-02-01 DIAGNOSIS — W19XXXS Unspecified fall, sequela: Secondary | ICD-10-CM | POA: Diagnosis not present

## 2016-02-01 DIAGNOSIS — M6281 Muscle weakness (generalized): Secondary | ICD-10-CM | POA: Diagnosis not present

## 2016-02-01 DIAGNOSIS — S60312S Abrasion of left thumb, sequela: Secondary | ICD-10-CM | POA: Diagnosis not present

## 2016-02-01 DIAGNOSIS — S0083XS Contusion of other part of head, sequela: Secondary | ICD-10-CM | POA: Diagnosis not present

## 2016-02-01 DIAGNOSIS — Z9181 History of falling: Secondary | ICD-10-CM | POA: Diagnosis not present

## 2016-02-05 DIAGNOSIS — F0390 Unspecified dementia without behavioral disturbance: Secondary | ICD-10-CM | POA: Diagnosis not present

## 2016-02-05 DIAGNOSIS — M6281 Muscle weakness (generalized): Secondary | ICD-10-CM | POA: Diagnosis not present

## 2016-02-05 DIAGNOSIS — F319 Bipolar disorder, unspecified: Secondary | ICD-10-CM | POA: Diagnosis not present

## 2016-02-05 DIAGNOSIS — Z742 Need for assistance at home and no other household member able to render care: Secondary | ICD-10-CM | POA: Diagnosis not present

## 2016-02-05 DIAGNOSIS — I10 Essential (primary) hypertension: Secondary | ICD-10-CM | POA: Diagnosis not present

## 2016-02-05 DIAGNOSIS — R2681 Unsteadiness on feet: Secondary | ICD-10-CM | POA: Diagnosis not present

## 2016-02-08 DIAGNOSIS — L739 Follicular disorder, unspecified: Secondary | ICD-10-CM | POA: Diagnosis not present

## 2016-02-12 DIAGNOSIS — M6281 Muscle weakness (generalized): Secondary | ICD-10-CM | POA: Diagnosis not present

## 2016-02-12 DIAGNOSIS — Z742 Need for assistance at home and no other household member able to render care: Secondary | ICD-10-CM | POA: Diagnosis not present

## 2016-02-12 DIAGNOSIS — R2681 Unsteadiness on feet: Secondary | ICD-10-CM | POA: Diagnosis not present

## 2016-02-12 DIAGNOSIS — I10 Essential (primary) hypertension: Secondary | ICD-10-CM | POA: Diagnosis not present

## 2016-02-12 DIAGNOSIS — F319 Bipolar disorder, unspecified: Secondary | ICD-10-CM | POA: Diagnosis not present

## 2016-02-12 DIAGNOSIS — F0390 Unspecified dementia without behavioral disturbance: Secondary | ICD-10-CM | POA: Diagnosis not present

## 2016-02-15 DIAGNOSIS — D649 Anemia, unspecified: Secondary | ICD-10-CM | POA: Diagnosis not present

## 2016-02-15 DIAGNOSIS — F3110 Bipolar disorder, current episode manic without psychotic features, unspecified: Secondary | ICD-10-CM | POA: Diagnosis not present

## 2016-02-15 DIAGNOSIS — I10 Essential (primary) hypertension: Secondary | ICD-10-CM | POA: Diagnosis not present

## 2016-02-15 DIAGNOSIS — F0390 Unspecified dementia without behavioral disturbance: Secondary | ICD-10-CM | POA: Diagnosis not present

## 2016-02-20 DIAGNOSIS — F0390 Unspecified dementia without behavioral disturbance: Secondary | ICD-10-CM | POA: Diagnosis not present

## 2016-02-20 DIAGNOSIS — Z79899 Other long term (current) drug therapy: Secondary | ICD-10-CM | POA: Diagnosis not present

## 2016-02-20 DIAGNOSIS — E559 Vitamin D deficiency, unspecified: Secondary | ICD-10-CM | POA: Diagnosis not present

## 2016-02-21 DIAGNOSIS — M6281 Muscle weakness (generalized): Secondary | ICD-10-CM | POA: Diagnosis not present

## 2016-02-21 DIAGNOSIS — F0390 Unspecified dementia without behavioral disturbance: Secondary | ICD-10-CM | POA: Diagnosis not present

## 2016-02-21 DIAGNOSIS — F319 Bipolar disorder, unspecified: Secondary | ICD-10-CM | POA: Diagnosis not present

## 2016-02-21 DIAGNOSIS — Z742 Need for assistance at home and no other household member able to render care: Secondary | ICD-10-CM | POA: Diagnosis not present

## 2016-02-21 DIAGNOSIS — R2681 Unsteadiness on feet: Secondary | ICD-10-CM | POA: Diagnosis not present

## 2016-02-21 DIAGNOSIS — I10 Essential (primary) hypertension: Secondary | ICD-10-CM | POA: Diagnosis not present

## 2016-02-26 DIAGNOSIS — Z742 Need for assistance at home and no other household member able to render care: Secondary | ICD-10-CM | POA: Diagnosis not present

## 2016-02-26 DIAGNOSIS — F0390 Unspecified dementia without behavioral disturbance: Secondary | ICD-10-CM | POA: Diagnosis not present

## 2016-02-26 DIAGNOSIS — R2681 Unsteadiness on feet: Secondary | ICD-10-CM | POA: Diagnosis not present

## 2016-02-26 DIAGNOSIS — I10 Essential (primary) hypertension: Secondary | ICD-10-CM | POA: Diagnosis not present

## 2016-02-26 DIAGNOSIS — M6281 Muscle weakness (generalized): Secondary | ICD-10-CM | POA: Diagnosis not present

## 2016-02-26 DIAGNOSIS — F319 Bipolar disorder, unspecified: Secondary | ICD-10-CM | POA: Diagnosis not present

## 2016-03-02 DIAGNOSIS — Z23 Encounter for immunization: Secondary | ICD-10-CM | POA: Diagnosis not present

## 2016-03-04 DIAGNOSIS — F0390 Unspecified dementia without behavioral disturbance: Secondary | ICD-10-CM | POA: Diagnosis not present

## 2016-03-04 DIAGNOSIS — I10 Essential (primary) hypertension: Secondary | ICD-10-CM | POA: Diagnosis not present

## 2016-03-04 DIAGNOSIS — Z742 Need for assistance at home and no other household member able to render care: Secondary | ICD-10-CM | POA: Diagnosis not present

## 2016-03-04 DIAGNOSIS — F319 Bipolar disorder, unspecified: Secondary | ICD-10-CM | POA: Diagnosis not present

## 2016-03-04 DIAGNOSIS — M6281 Muscle weakness (generalized): Secondary | ICD-10-CM | POA: Diagnosis not present

## 2016-03-04 DIAGNOSIS — R2681 Unsteadiness on feet: Secondary | ICD-10-CM | POA: Diagnosis not present

## 2016-03-07 DIAGNOSIS — M25512 Pain in left shoulder: Secondary | ICD-10-CM | POA: Diagnosis not present

## 2016-03-07 DIAGNOSIS — R54 Age-related physical debility: Secondary | ICD-10-CM | POA: Diagnosis not present

## 2016-03-07 DIAGNOSIS — Z9181 History of falling: Secondary | ICD-10-CM | POA: Diagnosis not present

## 2016-03-07 DIAGNOSIS — S42202K Unspecified fracture of upper end of left humerus, subsequent encounter for fracture with nonunion: Secondary | ICD-10-CM | POA: Diagnosis not present

## 2016-03-07 DIAGNOSIS — S40022A Contusion of left upper arm, initial encounter: Secondary | ICD-10-CM | POA: Diagnosis not present

## 2016-03-07 DIAGNOSIS — R269 Unspecified abnormalities of gait and mobility: Secondary | ICD-10-CM | POA: Diagnosis not present

## 2016-03-11 DIAGNOSIS — I10 Essential (primary) hypertension: Secondary | ICD-10-CM | POA: Diagnosis not present

## 2016-03-11 DIAGNOSIS — R2681 Unsteadiness on feet: Secondary | ICD-10-CM | POA: Diagnosis not present

## 2016-03-11 DIAGNOSIS — F0390 Unspecified dementia without behavioral disturbance: Secondary | ICD-10-CM | POA: Diagnosis not present

## 2016-03-11 DIAGNOSIS — M6281 Muscle weakness (generalized): Secondary | ICD-10-CM | POA: Diagnosis not present

## 2016-03-11 DIAGNOSIS — F319 Bipolar disorder, unspecified: Secondary | ICD-10-CM | POA: Diagnosis not present

## 2016-03-11 DIAGNOSIS — Z742 Need for assistance at home and no other household member able to render care: Secondary | ICD-10-CM | POA: Diagnosis not present

## 2016-03-18 DIAGNOSIS — Z742 Need for assistance at home and no other household member able to render care: Secondary | ICD-10-CM | POA: Diagnosis not present

## 2016-03-18 DIAGNOSIS — R2681 Unsteadiness on feet: Secondary | ICD-10-CM | POA: Diagnosis not present

## 2016-03-18 DIAGNOSIS — F0390 Unspecified dementia without behavioral disturbance: Secondary | ICD-10-CM | POA: Diagnosis not present

## 2016-03-18 DIAGNOSIS — F319 Bipolar disorder, unspecified: Secondary | ICD-10-CM | POA: Diagnosis not present

## 2016-03-18 DIAGNOSIS — M6281 Muscle weakness (generalized): Secondary | ICD-10-CM | POA: Diagnosis not present

## 2016-03-18 DIAGNOSIS — I10 Essential (primary) hypertension: Secondary | ICD-10-CM | POA: Diagnosis not present

## 2016-03-19 DIAGNOSIS — I10 Essential (primary) hypertension: Secondary | ICD-10-CM | POA: Diagnosis not present

## 2016-03-19 DIAGNOSIS — R2681 Unsteadiness on feet: Secondary | ICD-10-CM | POA: Diagnosis not present

## 2016-03-19 DIAGNOSIS — F0390 Unspecified dementia without behavioral disturbance: Secondary | ICD-10-CM | POA: Diagnosis not present

## 2016-03-19 DIAGNOSIS — Z9181 History of falling: Secondary | ICD-10-CM | POA: Diagnosis not present

## 2016-03-19 DIAGNOSIS — M6281 Muscle weakness (generalized): Secondary | ICD-10-CM | POA: Diagnosis not present

## 2016-03-19 DIAGNOSIS — Z742 Need for assistance at home and no other household member able to render care: Secondary | ICD-10-CM | POA: Diagnosis not present

## 2016-03-19 DIAGNOSIS — Z79891 Long term (current) use of opiate analgesic: Secondary | ICD-10-CM | POA: Diagnosis not present

## 2016-03-19 DIAGNOSIS — F319 Bipolar disorder, unspecified: Secondary | ICD-10-CM | POA: Diagnosis not present

## 2016-03-25 DIAGNOSIS — I10 Essential (primary) hypertension: Secondary | ICD-10-CM | POA: Diagnosis not present

## 2016-03-25 DIAGNOSIS — R2681 Unsteadiness on feet: Secondary | ICD-10-CM | POA: Diagnosis not present

## 2016-03-25 DIAGNOSIS — M6281 Muscle weakness (generalized): Secondary | ICD-10-CM | POA: Diagnosis not present

## 2016-03-25 DIAGNOSIS — Z742 Need for assistance at home and no other household member able to render care: Secondary | ICD-10-CM | POA: Diagnosis not present

## 2016-03-25 DIAGNOSIS — F319 Bipolar disorder, unspecified: Secondary | ICD-10-CM | POA: Diagnosis not present

## 2016-03-25 DIAGNOSIS — F0390 Unspecified dementia without behavioral disturbance: Secondary | ICD-10-CM | POA: Diagnosis not present

## 2016-04-02 DIAGNOSIS — M6281 Muscle weakness (generalized): Secondary | ICD-10-CM | POA: Diagnosis not present

## 2016-04-02 DIAGNOSIS — F0390 Unspecified dementia without behavioral disturbance: Secondary | ICD-10-CM | POA: Diagnosis not present

## 2016-04-02 DIAGNOSIS — F319 Bipolar disorder, unspecified: Secondary | ICD-10-CM | POA: Diagnosis not present

## 2016-04-02 DIAGNOSIS — Z742 Need for assistance at home and no other household member able to render care: Secondary | ICD-10-CM | POA: Diagnosis not present

## 2016-04-02 DIAGNOSIS — R2681 Unsteadiness on feet: Secondary | ICD-10-CM | POA: Diagnosis not present

## 2016-04-02 DIAGNOSIS — I10 Essential (primary) hypertension: Secondary | ICD-10-CM | POA: Diagnosis not present

## 2016-04-09 DIAGNOSIS — Z742 Need for assistance at home and no other household member able to render care: Secondary | ICD-10-CM | POA: Diagnosis not present

## 2016-04-09 DIAGNOSIS — I10 Essential (primary) hypertension: Secondary | ICD-10-CM | POA: Diagnosis not present

## 2016-04-09 DIAGNOSIS — M6281 Muscle weakness (generalized): Secondary | ICD-10-CM | POA: Diagnosis not present

## 2016-04-09 DIAGNOSIS — R2681 Unsteadiness on feet: Secondary | ICD-10-CM | POA: Diagnosis not present

## 2016-04-09 DIAGNOSIS — F319 Bipolar disorder, unspecified: Secondary | ICD-10-CM | POA: Diagnosis not present

## 2016-04-09 DIAGNOSIS — F0390 Unspecified dementia without behavioral disturbance: Secondary | ICD-10-CM | POA: Diagnosis not present

## 2016-04-15 DIAGNOSIS — R2681 Unsteadiness on feet: Secondary | ICD-10-CM | POA: Diagnosis not present

## 2016-04-15 DIAGNOSIS — M6281 Muscle weakness (generalized): Secondary | ICD-10-CM | POA: Diagnosis not present

## 2016-04-15 DIAGNOSIS — F0390 Unspecified dementia without behavioral disturbance: Secondary | ICD-10-CM | POA: Diagnosis not present

## 2016-04-15 DIAGNOSIS — F319 Bipolar disorder, unspecified: Secondary | ICD-10-CM | POA: Diagnosis not present

## 2016-04-15 DIAGNOSIS — I10 Essential (primary) hypertension: Secondary | ICD-10-CM | POA: Diagnosis not present

## 2016-04-15 DIAGNOSIS — Z742 Need for assistance at home and no other household member able to render care: Secondary | ICD-10-CM | POA: Diagnosis not present

## 2016-04-22 DIAGNOSIS — F0390 Unspecified dementia without behavioral disturbance: Secondary | ICD-10-CM | POA: Diagnosis not present

## 2016-04-22 DIAGNOSIS — M6281 Muscle weakness (generalized): Secondary | ICD-10-CM | POA: Diagnosis not present

## 2016-04-22 DIAGNOSIS — F319 Bipolar disorder, unspecified: Secondary | ICD-10-CM | POA: Diagnosis not present

## 2016-04-22 DIAGNOSIS — I10 Essential (primary) hypertension: Secondary | ICD-10-CM | POA: Diagnosis not present

## 2016-04-22 DIAGNOSIS — Z742 Need for assistance at home and no other household member able to render care: Secondary | ICD-10-CM | POA: Diagnosis not present

## 2016-04-22 DIAGNOSIS — R2681 Unsteadiness on feet: Secondary | ICD-10-CM | POA: Diagnosis not present

## 2016-05-01 DIAGNOSIS — Z742 Need for assistance at home and no other household member able to render care: Secondary | ICD-10-CM | POA: Diagnosis not present

## 2016-05-01 DIAGNOSIS — R2681 Unsteadiness on feet: Secondary | ICD-10-CM | POA: Diagnosis not present

## 2016-05-01 DIAGNOSIS — I10 Essential (primary) hypertension: Secondary | ICD-10-CM | POA: Diagnosis not present

## 2016-05-01 DIAGNOSIS — F319 Bipolar disorder, unspecified: Secondary | ICD-10-CM | POA: Diagnosis not present

## 2016-05-01 DIAGNOSIS — F0390 Unspecified dementia without behavioral disturbance: Secondary | ICD-10-CM | POA: Diagnosis not present

## 2016-05-01 DIAGNOSIS — M6281 Muscle weakness (generalized): Secondary | ICD-10-CM | POA: Diagnosis not present

## 2016-05-06 DIAGNOSIS — F319 Bipolar disorder, unspecified: Secondary | ICD-10-CM | POA: Diagnosis not present

## 2016-05-06 DIAGNOSIS — Z742 Need for assistance at home and no other household member able to render care: Secondary | ICD-10-CM | POA: Diagnosis not present

## 2016-05-06 DIAGNOSIS — M6281 Muscle weakness (generalized): Secondary | ICD-10-CM | POA: Diagnosis not present

## 2016-05-06 DIAGNOSIS — R2681 Unsteadiness on feet: Secondary | ICD-10-CM | POA: Diagnosis not present

## 2016-05-06 DIAGNOSIS — F0390 Unspecified dementia without behavioral disturbance: Secondary | ICD-10-CM | POA: Diagnosis not present

## 2016-05-06 DIAGNOSIS — I10 Essential (primary) hypertension: Secondary | ICD-10-CM | POA: Diagnosis not present

## 2016-05-09 DIAGNOSIS — I10 Essential (primary) hypertension: Secondary | ICD-10-CM | POA: Diagnosis not present

## 2016-05-09 DIAGNOSIS — F0390 Unspecified dementia without behavioral disturbance: Secondary | ICD-10-CM | POA: Diagnosis not present

## 2016-05-09 DIAGNOSIS — D649 Anemia, unspecified: Secondary | ICD-10-CM | POA: Diagnosis not present

## 2016-05-09 DIAGNOSIS — I7389 Other specified peripheral vascular diseases: Secondary | ICD-10-CM | POA: Diagnosis not present

## 2016-05-13 DIAGNOSIS — R2681 Unsteadiness on feet: Secondary | ICD-10-CM | POA: Diagnosis not present

## 2016-05-13 DIAGNOSIS — F319 Bipolar disorder, unspecified: Secondary | ICD-10-CM | POA: Diagnosis not present

## 2016-05-13 DIAGNOSIS — F0390 Unspecified dementia without behavioral disturbance: Secondary | ICD-10-CM | POA: Diagnosis not present

## 2016-05-13 DIAGNOSIS — Z742 Need for assistance at home and no other household member able to render care: Secondary | ICD-10-CM | POA: Diagnosis not present

## 2016-05-13 DIAGNOSIS — I10 Essential (primary) hypertension: Secondary | ICD-10-CM | POA: Diagnosis not present

## 2016-05-13 DIAGNOSIS — M6281 Muscle weakness (generalized): Secondary | ICD-10-CM | POA: Diagnosis not present

## 2016-05-14 DIAGNOSIS — B351 Tinea unguium: Secondary | ICD-10-CM | POA: Diagnosis not present

## 2016-05-14 DIAGNOSIS — M79671 Pain in right foot: Secondary | ICD-10-CM | POA: Diagnosis not present

## 2016-05-18 DIAGNOSIS — M6281 Muscle weakness (generalized): Secondary | ICD-10-CM | POA: Diagnosis not present

## 2016-05-18 DIAGNOSIS — Z79891 Long term (current) use of opiate analgesic: Secondary | ICD-10-CM | POA: Diagnosis not present

## 2016-05-18 DIAGNOSIS — F0390 Unspecified dementia without behavioral disturbance: Secondary | ICD-10-CM | POA: Diagnosis not present

## 2016-05-18 DIAGNOSIS — Z742 Need for assistance at home and no other household member able to render care: Secondary | ICD-10-CM | POA: Diagnosis not present

## 2016-05-18 DIAGNOSIS — F319 Bipolar disorder, unspecified: Secondary | ICD-10-CM | POA: Diagnosis not present

## 2016-05-18 DIAGNOSIS — I10 Essential (primary) hypertension: Secondary | ICD-10-CM | POA: Diagnosis not present

## 2016-05-18 DIAGNOSIS — Z9181 History of falling: Secondary | ICD-10-CM | POA: Diagnosis not present

## 2016-05-18 DIAGNOSIS — R2681 Unsteadiness on feet: Secondary | ICD-10-CM | POA: Diagnosis not present

## 2016-05-20 DIAGNOSIS — F0390 Unspecified dementia without behavioral disturbance: Secondary | ICD-10-CM | POA: Diagnosis not present

## 2016-05-20 DIAGNOSIS — M6281 Muscle weakness (generalized): Secondary | ICD-10-CM | POA: Diagnosis not present

## 2016-05-20 DIAGNOSIS — F319 Bipolar disorder, unspecified: Secondary | ICD-10-CM | POA: Diagnosis not present

## 2016-05-20 DIAGNOSIS — R2681 Unsteadiness on feet: Secondary | ICD-10-CM | POA: Diagnosis not present

## 2016-05-20 DIAGNOSIS — Z742 Need for assistance at home and no other household member able to render care: Secondary | ICD-10-CM | POA: Diagnosis not present

## 2016-05-20 DIAGNOSIS — I10 Essential (primary) hypertension: Secondary | ICD-10-CM | POA: Diagnosis not present

## 2016-06-03 DIAGNOSIS — I10 Essential (primary) hypertension: Secondary | ICD-10-CM | POA: Diagnosis not present

## 2016-06-03 DIAGNOSIS — Z742 Need for assistance at home and no other household member able to render care: Secondary | ICD-10-CM | POA: Diagnosis not present

## 2016-06-03 DIAGNOSIS — F319 Bipolar disorder, unspecified: Secondary | ICD-10-CM | POA: Diagnosis not present

## 2016-06-03 DIAGNOSIS — R2681 Unsteadiness on feet: Secondary | ICD-10-CM | POA: Diagnosis not present

## 2016-06-03 DIAGNOSIS — F0390 Unspecified dementia without behavioral disturbance: Secondary | ICD-10-CM | POA: Diagnosis not present

## 2016-06-03 DIAGNOSIS — M6281 Muscle weakness (generalized): Secondary | ICD-10-CM | POA: Diagnosis not present

## 2016-06-17 DIAGNOSIS — F319 Bipolar disorder, unspecified: Secondary | ICD-10-CM | POA: Diagnosis not present

## 2016-06-17 DIAGNOSIS — F0390 Unspecified dementia without behavioral disturbance: Secondary | ICD-10-CM | POA: Diagnosis not present

## 2016-06-17 DIAGNOSIS — M6281 Muscle weakness (generalized): Secondary | ICD-10-CM | POA: Diagnosis not present

## 2016-06-17 DIAGNOSIS — I10 Essential (primary) hypertension: Secondary | ICD-10-CM | POA: Diagnosis not present

## 2016-06-17 DIAGNOSIS — R2681 Unsteadiness on feet: Secondary | ICD-10-CM | POA: Diagnosis not present

## 2016-06-17 DIAGNOSIS — Z742 Need for assistance at home and no other household member able to render care: Secondary | ICD-10-CM | POA: Diagnosis not present

## 2016-07-02 DIAGNOSIS — F0390 Unspecified dementia without behavioral disturbance: Secondary | ICD-10-CM | POA: Diagnosis not present

## 2016-07-02 DIAGNOSIS — M6281 Muscle weakness (generalized): Secondary | ICD-10-CM | POA: Diagnosis not present

## 2016-07-02 DIAGNOSIS — R2681 Unsteadiness on feet: Secondary | ICD-10-CM | POA: Diagnosis not present

## 2016-07-02 DIAGNOSIS — Z742 Need for assistance at home and no other household member able to render care: Secondary | ICD-10-CM | POA: Diagnosis not present

## 2016-07-02 DIAGNOSIS — F319 Bipolar disorder, unspecified: Secondary | ICD-10-CM | POA: Diagnosis not present

## 2016-07-02 DIAGNOSIS — I10 Essential (primary) hypertension: Secondary | ICD-10-CM | POA: Diagnosis not present

## 2016-07-15 DIAGNOSIS — Z742 Need for assistance at home and no other household member able to render care: Secondary | ICD-10-CM | POA: Diagnosis not present

## 2016-07-15 DIAGNOSIS — I10 Essential (primary) hypertension: Secondary | ICD-10-CM | POA: Diagnosis not present

## 2016-07-15 DIAGNOSIS — F319 Bipolar disorder, unspecified: Secondary | ICD-10-CM | POA: Diagnosis not present

## 2016-07-15 DIAGNOSIS — M6281 Muscle weakness (generalized): Secondary | ICD-10-CM | POA: Diagnosis not present

## 2016-07-15 DIAGNOSIS — R2681 Unsteadiness on feet: Secondary | ICD-10-CM | POA: Diagnosis not present

## 2016-07-15 DIAGNOSIS — F0390 Unspecified dementia without behavioral disturbance: Secondary | ICD-10-CM | POA: Diagnosis not present

## 2016-08-08 DIAGNOSIS — F0390 Unspecified dementia without behavioral disturbance: Secondary | ICD-10-CM | POA: Diagnosis not present

## 2016-08-08 DIAGNOSIS — I10 Essential (primary) hypertension: Secondary | ICD-10-CM | POA: Diagnosis not present

## 2016-08-08 DIAGNOSIS — G4700 Insomnia, unspecified: Secondary | ICD-10-CM | POA: Diagnosis not present

## 2016-08-08 DIAGNOSIS — I7389 Other specified peripheral vascular diseases: Secondary | ICD-10-CM | POA: Diagnosis not present

## 2016-08-09 DIAGNOSIS — E119 Type 2 diabetes mellitus without complications: Secondary | ICD-10-CM | POA: Diagnosis not present

## 2016-08-09 DIAGNOSIS — Z79899 Other long term (current) drug therapy: Secondary | ICD-10-CM | POA: Diagnosis not present

## 2016-08-09 DIAGNOSIS — E782 Mixed hyperlipidemia: Secondary | ICD-10-CM | POA: Diagnosis not present

## 2016-08-09 DIAGNOSIS — E559 Vitamin D deficiency, unspecified: Secondary | ICD-10-CM | POA: Diagnosis not present

## 2016-08-09 DIAGNOSIS — D518 Other vitamin B12 deficiency anemias: Secondary | ICD-10-CM | POA: Diagnosis not present

## 2016-08-13 DIAGNOSIS — I7389 Other specified peripheral vascular diseases: Secondary | ICD-10-CM | POA: Diagnosis not present

## 2016-08-13 DIAGNOSIS — I10 Essential (primary) hypertension: Secondary | ICD-10-CM | POA: Diagnosis not present

## 2016-08-13 DIAGNOSIS — R296 Repeated falls: Secondary | ICD-10-CM | POA: Diagnosis not present

## 2016-08-13 DIAGNOSIS — F0390 Unspecified dementia without behavioral disturbance: Secondary | ICD-10-CM | POA: Diagnosis not present

## 2016-08-13 DIAGNOSIS — R2689 Other abnormalities of gait and mobility: Secondary | ICD-10-CM | POA: Diagnosis not present

## 2016-08-13 DIAGNOSIS — F319 Bipolar disorder, unspecified: Secondary | ICD-10-CM | POA: Diagnosis not present

## 2016-08-15 DIAGNOSIS — R2689 Other abnormalities of gait and mobility: Secondary | ICD-10-CM | POA: Diagnosis not present

## 2016-08-15 DIAGNOSIS — I7389 Other specified peripheral vascular diseases: Secondary | ICD-10-CM | POA: Diagnosis not present

## 2016-08-15 DIAGNOSIS — F319 Bipolar disorder, unspecified: Secondary | ICD-10-CM | POA: Diagnosis not present

## 2016-08-15 DIAGNOSIS — R296 Repeated falls: Secondary | ICD-10-CM | POA: Diagnosis not present

## 2016-08-15 DIAGNOSIS — I10 Essential (primary) hypertension: Secondary | ICD-10-CM | POA: Diagnosis not present

## 2016-08-15 DIAGNOSIS — F0390 Unspecified dementia without behavioral disturbance: Secondary | ICD-10-CM | POA: Diagnosis not present

## 2016-08-20 DIAGNOSIS — I10 Essential (primary) hypertension: Secondary | ICD-10-CM | POA: Diagnosis not present

## 2016-08-20 DIAGNOSIS — I7389 Other specified peripheral vascular diseases: Secondary | ICD-10-CM | POA: Diagnosis not present

## 2016-08-20 DIAGNOSIS — R296 Repeated falls: Secondary | ICD-10-CM | POA: Diagnosis not present

## 2016-08-20 DIAGNOSIS — R2689 Other abnormalities of gait and mobility: Secondary | ICD-10-CM | POA: Diagnosis not present

## 2016-08-20 DIAGNOSIS — F0390 Unspecified dementia without behavioral disturbance: Secondary | ICD-10-CM | POA: Diagnosis not present

## 2016-08-20 DIAGNOSIS — F319 Bipolar disorder, unspecified: Secondary | ICD-10-CM | POA: Diagnosis not present

## 2016-08-22 DIAGNOSIS — F319 Bipolar disorder, unspecified: Secondary | ICD-10-CM | POA: Diagnosis not present

## 2016-08-22 DIAGNOSIS — R296 Repeated falls: Secondary | ICD-10-CM | POA: Diagnosis not present

## 2016-08-22 DIAGNOSIS — I10 Essential (primary) hypertension: Secondary | ICD-10-CM | POA: Diagnosis not present

## 2016-08-22 DIAGNOSIS — I7389 Other specified peripheral vascular diseases: Secondary | ICD-10-CM | POA: Diagnosis not present

## 2016-08-22 DIAGNOSIS — F0390 Unspecified dementia without behavioral disturbance: Secondary | ICD-10-CM | POA: Diagnosis not present

## 2016-08-22 DIAGNOSIS — R2689 Other abnormalities of gait and mobility: Secondary | ICD-10-CM | POA: Diagnosis not present

## 2016-08-23 DIAGNOSIS — Z79899 Other long term (current) drug therapy: Secondary | ICD-10-CM | POA: Diagnosis not present

## 2016-08-27 DIAGNOSIS — R2689 Other abnormalities of gait and mobility: Secondary | ICD-10-CM | POA: Diagnosis not present

## 2016-08-27 DIAGNOSIS — R296 Repeated falls: Secondary | ICD-10-CM | POA: Diagnosis not present

## 2016-08-27 DIAGNOSIS — F0390 Unspecified dementia without behavioral disturbance: Secondary | ICD-10-CM | POA: Diagnosis not present

## 2016-08-27 DIAGNOSIS — I7389 Other specified peripheral vascular diseases: Secondary | ICD-10-CM | POA: Diagnosis not present

## 2016-08-27 DIAGNOSIS — I10 Essential (primary) hypertension: Secondary | ICD-10-CM | POA: Diagnosis not present

## 2016-08-27 DIAGNOSIS — F319 Bipolar disorder, unspecified: Secondary | ICD-10-CM | POA: Diagnosis not present

## 2016-08-29 DIAGNOSIS — R2689 Other abnormalities of gait and mobility: Secondary | ICD-10-CM | POA: Diagnosis not present

## 2016-08-29 DIAGNOSIS — I10 Essential (primary) hypertension: Secondary | ICD-10-CM | POA: Diagnosis not present

## 2016-08-29 DIAGNOSIS — F319 Bipolar disorder, unspecified: Secondary | ICD-10-CM | POA: Diagnosis not present

## 2016-08-29 DIAGNOSIS — R296 Repeated falls: Secondary | ICD-10-CM | POA: Diagnosis not present

## 2016-08-29 DIAGNOSIS — F0390 Unspecified dementia without behavioral disturbance: Secondary | ICD-10-CM | POA: Diagnosis not present

## 2016-08-29 DIAGNOSIS — I7389 Other specified peripheral vascular diseases: Secondary | ICD-10-CM | POA: Diagnosis not present

## 2016-09-03 DIAGNOSIS — R2689 Other abnormalities of gait and mobility: Secondary | ICD-10-CM | POA: Diagnosis not present

## 2016-09-03 DIAGNOSIS — F0390 Unspecified dementia without behavioral disturbance: Secondary | ICD-10-CM | POA: Diagnosis not present

## 2016-09-03 DIAGNOSIS — I7389 Other specified peripheral vascular diseases: Secondary | ICD-10-CM | POA: Diagnosis not present

## 2016-09-03 DIAGNOSIS — I10 Essential (primary) hypertension: Secondary | ICD-10-CM | POA: Diagnosis not present

## 2016-09-03 DIAGNOSIS — F319 Bipolar disorder, unspecified: Secondary | ICD-10-CM | POA: Diagnosis not present

## 2016-09-03 DIAGNOSIS — R296 Repeated falls: Secondary | ICD-10-CM | POA: Diagnosis not present

## 2016-09-05 DIAGNOSIS — F0390 Unspecified dementia without behavioral disturbance: Secondary | ICD-10-CM | POA: Diagnosis not present

## 2016-09-05 DIAGNOSIS — F319 Bipolar disorder, unspecified: Secondary | ICD-10-CM | POA: Diagnosis not present

## 2016-09-05 DIAGNOSIS — R296 Repeated falls: Secondary | ICD-10-CM | POA: Diagnosis not present

## 2016-09-05 DIAGNOSIS — R269 Unspecified abnormalities of gait and mobility: Secondary | ICD-10-CM | POA: Diagnosis not present

## 2016-09-05 DIAGNOSIS — R54 Age-related physical debility: Secondary | ICD-10-CM | POA: Diagnosis not present

## 2016-09-05 DIAGNOSIS — I7389 Other specified peripheral vascular diseases: Secondary | ICD-10-CM | POA: Diagnosis not present

## 2016-09-05 DIAGNOSIS — J309 Allergic rhinitis, unspecified: Secondary | ICD-10-CM | POA: Diagnosis not present

## 2016-09-05 DIAGNOSIS — I10 Essential (primary) hypertension: Secondary | ICD-10-CM | POA: Diagnosis not present

## 2016-09-05 DIAGNOSIS — R2689 Other abnormalities of gait and mobility: Secondary | ICD-10-CM | POA: Diagnosis not present

## 2016-09-12 DIAGNOSIS — I7389 Other specified peripheral vascular diseases: Secondary | ICD-10-CM | POA: Diagnosis not present

## 2016-09-12 DIAGNOSIS — I10 Essential (primary) hypertension: Secondary | ICD-10-CM | POA: Diagnosis not present

## 2016-09-12 DIAGNOSIS — R2689 Other abnormalities of gait and mobility: Secondary | ICD-10-CM | POA: Diagnosis not present

## 2016-09-12 DIAGNOSIS — F0390 Unspecified dementia without behavioral disturbance: Secondary | ICD-10-CM | POA: Diagnosis not present

## 2016-09-12 DIAGNOSIS — F319 Bipolar disorder, unspecified: Secondary | ICD-10-CM | POA: Diagnosis not present

## 2016-09-12 DIAGNOSIS — R296 Repeated falls: Secondary | ICD-10-CM | POA: Diagnosis not present

## 2016-09-13 DIAGNOSIS — F319 Bipolar disorder, unspecified: Secondary | ICD-10-CM | POA: Diagnosis not present

## 2016-09-13 DIAGNOSIS — F0390 Unspecified dementia without behavioral disturbance: Secondary | ICD-10-CM | POA: Diagnosis not present

## 2016-09-13 DIAGNOSIS — I7389 Other specified peripheral vascular diseases: Secondary | ICD-10-CM | POA: Diagnosis not present

## 2016-09-13 DIAGNOSIS — I10 Essential (primary) hypertension: Secondary | ICD-10-CM | POA: Diagnosis not present

## 2016-09-13 DIAGNOSIS — R2689 Other abnormalities of gait and mobility: Secondary | ICD-10-CM | POA: Diagnosis not present

## 2016-09-13 DIAGNOSIS — R296 Repeated falls: Secondary | ICD-10-CM | POA: Diagnosis not present

## 2016-09-16 DIAGNOSIS — R2689 Other abnormalities of gait and mobility: Secondary | ICD-10-CM | POA: Diagnosis not present

## 2016-09-16 DIAGNOSIS — F319 Bipolar disorder, unspecified: Secondary | ICD-10-CM | POA: Diagnosis not present

## 2016-09-16 DIAGNOSIS — I7389 Other specified peripheral vascular diseases: Secondary | ICD-10-CM | POA: Diagnosis not present

## 2016-09-16 DIAGNOSIS — I10 Essential (primary) hypertension: Secondary | ICD-10-CM | POA: Diagnosis not present

## 2016-09-16 DIAGNOSIS — R296 Repeated falls: Secondary | ICD-10-CM | POA: Diagnosis not present

## 2016-09-16 DIAGNOSIS — F0390 Unspecified dementia without behavioral disturbance: Secondary | ICD-10-CM | POA: Diagnosis not present

## 2016-09-19 DIAGNOSIS — R296 Repeated falls: Secondary | ICD-10-CM | POA: Diagnosis not present

## 2016-09-19 DIAGNOSIS — I7389 Other specified peripheral vascular diseases: Secondary | ICD-10-CM | POA: Diagnosis not present

## 2016-09-19 DIAGNOSIS — F0390 Unspecified dementia without behavioral disturbance: Secondary | ICD-10-CM | POA: Diagnosis not present

## 2016-09-19 DIAGNOSIS — F319 Bipolar disorder, unspecified: Secondary | ICD-10-CM | POA: Diagnosis not present

## 2016-09-19 DIAGNOSIS — I10 Essential (primary) hypertension: Secondary | ICD-10-CM | POA: Diagnosis not present

## 2016-09-19 DIAGNOSIS — R2689 Other abnormalities of gait and mobility: Secondary | ICD-10-CM | POA: Diagnosis not present

## 2016-09-23 DIAGNOSIS — F319 Bipolar disorder, unspecified: Secondary | ICD-10-CM | POA: Diagnosis not present

## 2016-09-23 DIAGNOSIS — F0390 Unspecified dementia without behavioral disturbance: Secondary | ICD-10-CM | POA: Diagnosis not present

## 2016-09-23 DIAGNOSIS — I7389 Other specified peripheral vascular diseases: Secondary | ICD-10-CM | POA: Diagnosis not present

## 2016-09-23 DIAGNOSIS — R2689 Other abnormalities of gait and mobility: Secondary | ICD-10-CM | POA: Diagnosis not present

## 2016-09-23 DIAGNOSIS — I10 Essential (primary) hypertension: Secondary | ICD-10-CM | POA: Diagnosis not present

## 2016-09-23 DIAGNOSIS — R296 Repeated falls: Secondary | ICD-10-CM | POA: Diagnosis not present

## 2016-09-26 DIAGNOSIS — R296 Repeated falls: Secondary | ICD-10-CM | POA: Diagnosis not present

## 2016-09-26 DIAGNOSIS — I7389 Other specified peripheral vascular diseases: Secondary | ICD-10-CM | POA: Diagnosis not present

## 2016-09-26 DIAGNOSIS — F319 Bipolar disorder, unspecified: Secondary | ICD-10-CM | POA: Diagnosis not present

## 2016-09-26 DIAGNOSIS — I10 Essential (primary) hypertension: Secondary | ICD-10-CM | POA: Diagnosis not present

## 2016-09-26 DIAGNOSIS — R2689 Other abnormalities of gait and mobility: Secondary | ICD-10-CM | POA: Diagnosis not present

## 2016-09-26 DIAGNOSIS — F0390 Unspecified dementia without behavioral disturbance: Secondary | ICD-10-CM | POA: Diagnosis not present

## 2016-10-01 DIAGNOSIS — F0390 Unspecified dementia without behavioral disturbance: Secondary | ICD-10-CM | POA: Diagnosis not present

## 2016-10-01 DIAGNOSIS — F319 Bipolar disorder, unspecified: Secondary | ICD-10-CM | POA: Diagnosis not present

## 2016-10-01 DIAGNOSIS — R2689 Other abnormalities of gait and mobility: Secondary | ICD-10-CM | POA: Diagnosis not present

## 2016-10-01 DIAGNOSIS — I7389 Other specified peripheral vascular diseases: Secondary | ICD-10-CM | POA: Diagnosis not present

## 2016-10-01 DIAGNOSIS — I10 Essential (primary) hypertension: Secondary | ICD-10-CM | POA: Diagnosis not present

## 2016-10-01 DIAGNOSIS — R296 Repeated falls: Secondary | ICD-10-CM | POA: Diagnosis not present

## 2016-10-03 DIAGNOSIS — F0390 Unspecified dementia without behavioral disturbance: Secondary | ICD-10-CM | POA: Diagnosis not present

## 2016-10-03 DIAGNOSIS — M6281 Muscle weakness (generalized): Secondary | ICD-10-CM | POA: Diagnosis not present

## 2016-10-03 DIAGNOSIS — W19XXXA Unspecified fall, initial encounter: Secondary | ICD-10-CM | POA: Diagnosis not present

## 2016-10-03 DIAGNOSIS — Z9181 History of falling: Secondary | ICD-10-CM | POA: Diagnosis not present

## 2016-10-03 DIAGNOSIS — R296 Repeated falls: Secondary | ICD-10-CM | POA: Diagnosis not present

## 2016-10-03 DIAGNOSIS — R54 Age-related physical debility: Secondary | ICD-10-CM | POA: Diagnosis not present

## 2016-10-03 DIAGNOSIS — F319 Bipolar disorder, unspecified: Secondary | ICD-10-CM | POA: Diagnosis not present

## 2016-10-03 DIAGNOSIS — R269 Unspecified abnormalities of gait and mobility: Secondary | ICD-10-CM | POA: Diagnosis not present

## 2016-10-03 DIAGNOSIS — I7389 Other specified peripheral vascular diseases: Secondary | ICD-10-CM | POA: Diagnosis not present

## 2016-10-03 DIAGNOSIS — R2689 Other abnormalities of gait and mobility: Secondary | ICD-10-CM | POA: Diagnosis not present

## 2016-10-03 DIAGNOSIS — I10 Essential (primary) hypertension: Secondary | ICD-10-CM | POA: Diagnosis not present

## 2016-10-08 DIAGNOSIS — R2689 Other abnormalities of gait and mobility: Secondary | ICD-10-CM | POA: Diagnosis not present

## 2016-10-08 DIAGNOSIS — I7389 Other specified peripheral vascular diseases: Secondary | ICD-10-CM | POA: Diagnosis not present

## 2016-10-08 DIAGNOSIS — F319 Bipolar disorder, unspecified: Secondary | ICD-10-CM | POA: Diagnosis not present

## 2016-10-08 DIAGNOSIS — I10 Essential (primary) hypertension: Secondary | ICD-10-CM | POA: Diagnosis not present

## 2016-10-08 DIAGNOSIS — F0390 Unspecified dementia without behavioral disturbance: Secondary | ICD-10-CM | POA: Diagnosis not present

## 2016-10-08 DIAGNOSIS — R296 Repeated falls: Secondary | ICD-10-CM | POA: Diagnosis not present

## 2016-10-11 DIAGNOSIS — R296 Repeated falls: Secondary | ICD-10-CM | POA: Diagnosis not present

## 2016-10-11 DIAGNOSIS — F0390 Unspecified dementia without behavioral disturbance: Secondary | ICD-10-CM | POA: Diagnosis not present

## 2016-10-11 DIAGNOSIS — I10 Essential (primary) hypertension: Secondary | ICD-10-CM | POA: Diagnosis not present

## 2016-10-11 DIAGNOSIS — I7389 Other specified peripheral vascular diseases: Secondary | ICD-10-CM | POA: Diagnosis not present

## 2016-10-11 DIAGNOSIS — R2689 Other abnormalities of gait and mobility: Secondary | ICD-10-CM | POA: Diagnosis not present

## 2016-10-11 DIAGNOSIS — F319 Bipolar disorder, unspecified: Secondary | ICD-10-CM | POA: Diagnosis not present

## 2016-10-12 DIAGNOSIS — I7389 Other specified peripheral vascular diseases: Secondary | ICD-10-CM | POA: Diagnosis not present

## 2016-10-12 DIAGNOSIS — F319 Bipolar disorder, unspecified: Secondary | ICD-10-CM | POA: Diagnosis not present

## 2016-10-12 DIAGNOSIS — R296 Repeated falls: Secondary | ICD-10-CM | POA: Diagnosis not present

## 2016-10-12 DIAGNOSIS — F0390 Unspecified dementia without behavioral disturbance: Secondary | ICD-10-CM | POA: Diagnosis not present

## 2016-10-12 DIAGNOSIS — R2689 Other abnormalities of gait and mobility: Secondary | ICD-10-CM | POA: Diagnosis not present

## 2016-10-12 DIAGNOSIS — I10 Essential (primary) hypertension: Secondary | ICD-10-CM | POA: Diagnosis not present

## 2016-10-15 DIAGNOSIS — R296 Repeated falls: Secondary | ICD-10-CM | POA: Diagnosis not present

## 2016-10-15 DIAGNOSIS — F0390 Unspecified dementia without behavioral disturbance: Secondary | ICD-10-CM | POA: Diagnosis not present

## 2016-10-15 DIAGNOSIS — I10 Essential (primary) hypertension: Secondary | ICD-10-CM | POA: Diagnosis not present

## 2016-10-15 DIAGNOSIS — R2689 Other abnormalities of gait and mobility: Secondary | ICD-10-CM | POA: Diagnosis not present

## 2016-10-15 DIAGNOSIS — I7389 Other specified peripheral vascular diseases: Secondary | ICD-10-CM | POA: Diagnosis not present

## 2016-10-15 DIAGNOSIS — F319 Bipolar disorder, unspecified: Secondary | ICD-10-CM | POA: Diagnosis not present

## 2016-10-17 DIAGNOSIS — I7389 Other specified peripheral vascular diseases: Secondary | ICD-10-CM | POA: Diagnosis not present

## 2016-10-17 DIAGNOSIS — F319 Bipolar disorder, unspecified: Secondary | ICD-10-CM | POA: Diagnosis not present

## 2016-10-17 DIAGNOSIS — R2689 Other abnormalities of gait and mobility: Secondary | ICD-10-CM | POA: Diagnosis not present

## 2016-10-17 DIAGNOSIS — F0390 Unspecified dementia without behavioral disturbance: Secondary | ICD-10-CM | POA: Diagnosis not present

## 2016-10-17 DIAGNOSIS — R296 Repeated falls: Secondary | ICD-10-CM | POA: Diagnosis not present

## 2016-10-17 DIAGNOSIS — I10 Essential (primary) hypertension: Secondary | ICD-10-CM | POA: Diagnosis not present

## 2016-10-23 DIAGNOSIS — I10 Essential (primary) hypertension: Secondary | ICD-10-CM | POA: Diagnosis not present

## 2016-10-23 DIAGNOSIS — R2689 Other abnormalities of gait and mobility: Secondary | ICD-10-CM | POA: Diagnosis not present

## 2016-10-23 DIAGNOSIS — F0390 Unspecified dementia without behavioral disturbance: Secondary | ICD-10-CM | POA: Diagnosis not present

## 2016-10-23 DIAGNOSIS — F319 Bipolar disorder, unspecified: Secondary | ICD-10-CM | POA: Diagnosis not present

## 2016-10-23 DIAGNOSIS — I7389 Other specified peripheral vascular diseases: Secondary | ICD-10-CM | POA: Diagnosis not present

## 2016-10-23 DIAGNOSIS — R296 Repeated falls: Secondary | ICD-10-CM | POA: Diagnosis not present

## 2016-10-25 DIAGNOSIS — F319 Bipolar disorder, unspecified: Secondary | ICD-10-CM | POA: Diagnosis not present

## 2016-10-25 DIAGNOSIS — R296 Repeated falls: Secondary | ICD-10-CM | POA: Diagnosis not present

## 2016-10-25 DIAGNOSIS — R2689 Other abnormalities of gait and mobility: Secondary | ICD-10-CM | POA: Diagnosis not present

## 2016-10-25 DIAGNOSIS — I10 Essential (primary) hypertension: Secondary | ICD-10-CM | POA: Diagnosis not present

## 2016-10-25 DIAGNOSIS — F0390 Unspecified dementia without behavioral disturbance: Secondary | ICD-10-CM | POA: Diagnosis not present

## 2016-10-25 DIAGNOSIS — I7389 Other specified peripheral vascular diseases: Secondary | ICD-10-CM | POA: Diagnosis not present

## 2016-10-29 DIAGNOSIS — R2689 Other abnormalities of gait and mobility: Secondary | ICD-10-CM | POA: Diagnosis not present

## 2016-10-29 DIAGNOSIS — F0390 Unspecified dementia without behavioral disturbance: Secondary | ICD-10-CM | POA: Diagnosis not present

## 2016-10-29 DIAGNOSIS — I10 Essential (primary) hypertension: Secondary | ICD-10-CM | POA: Diagnosis not present

## 2016-10-29 DIAGNOSIS — R296 Repeated falls: Secondary | ICD-10-CM | POA: Diagnosis not present

## 2016-10-29 DIAGNOSIS — I7389 Other specified peripheral vascular diseases: Secondary | ICD-10-CM | POA: Diagnosis not present

## 2016-10-29 DIAGNOSIS — F319 Bipolar disorder, unspecified: Secondary | ICD-10-CM | POA: Diagnosis not present

## 2016-10-31 DIAGNOSIS — R2689 Other abnormalities of gait and mobility: Secondary | ICD-10-CM | POA: Diagnosis not present

## 2016-10-31 DIAGNOSIS — R296 Repeated falls: Secondary | ICD-10-CM | POA: Diagnosis not present

## 2016-10-31 DIAGNOSIS — F319 Bipolar disorder, unspecified: Secondary | ICD-10-CM | POA: Diagnosis not present

## 2016-10-31 DIAGNOSIS — I7389 Other specified peripheral vascular diseases: Secondary | ICD-10-CM | POA: Diagnosis not present

## 2016-10-31 DIAGNOSIS — I10 Essential (primary) hypertension: Secondary | ICD-10-CM | POA: Diagnosis not present

## 2016-10-31 DIAGNOSIS — F0390 Unspecified dementia without behavioral disturbance: Secondary | ICD-10-CM | POA: Diagnosis not present

## 2016-11-05 DIAGNOSIS — I10 Essential (primary) hypertension: Secondary | ICD-10-CM | POA: Diagnosis not present

## 2016-11-05 DIAGNOSIS — F319 Bipolar disorder, unspecified: Secondary | ICD-10-CM | POA: Diagnosis not present

## 2016-11-05 DIAGNOSIS — F0390 Unspecified dementia without behavioral disturbance: Secondary | ICD-10-CM | POA: Diagnosis not present

## 2016-11-05 DIAGNOSIS — I7389 Other specified peripheral vascular diseases: Secondary | ICD-10-CM | POA: Diagnosis not present

## 2016-11-05 DIAGNOSIS — R296 Repeated falls: Secondary | ICD-10-CM | POA: Diagnosis not present

## 2016-11-05 DIAGNOSIS — R2689 Other abnormalities of gait and mobility: Secondary | ICD-10-CM | POA: Diagnosis not present

## 2016-11-07 DIAGNOSIS — R2689 Other abnormalities of gait and mobility: Secondary | ICD-10-CM | POA: Diagnosis not present

## 2016-11-07 DIAGNOSIS — R54 Age-related physical debility: Secondary | ICD-10-CM | POA: Diagnosis not present

## 2016-11-07 DIAGNOSIS — R296 Repeated falls: Secondary | ICD-10-CM | POA: Diagnosis not present

## 2016-11-07 DIAGNOSIS — I10 Essential (primary) hypertension: Secondary | ICD-10-CM | POA: Diagnosis not present

## 2016-11-07 DIAGNOSIS — F0390 Unspecified dementia without behavioral disturbance: Secondary | ICD-10-CM | POA: Diagnosis not present

## 2016-11-07 DIAGNOSIS — F319 Bipolar disorder, unspecified: Secondary | ICD-10-CM | POA: Diagnosis not present

## 2016-11-07 DIAGNOSIS — I7389 Other specified peripheral vascular diseases: Secondary | ICD-10-CM | POA: Diagnosis not present

## 2016-11-07 DIAGNOSIS — R1084 Generalized abdominal pain: Secondary | ICD-10-CM | POA: Diagnosis not present

## 2016-11-12 DIAGNOSIS — F319 Bipolar disorder, unspecified: Secondary | ICD-10-CM | POA: Diagnosis not present

## 2016-11-12 DIAGNOSIS — M79671 Pain in right foot: Secondary | ICD-10-CM | POA: Diagnosis not present

## 2016-11-12 DIAGNOSIS — F0390 Unspecified dementia without behavioral disturbance: Secondary | ICD-10-CM | POA: Diagnosis not present

## 2016-11-12 DIAGNOSIS — B351 Tinea unguium: Secondary | ICD-10-CM | POA: Diagnosis not present

## 2016-11-12 DIAGNOSIS — I7389 Other specified peripheral vascular diseases: Secondary | ICD-10-CM | POA: Diagnosis not present

## 2016-11-12 DIAGNOSIS — R296 Repeated falls: Secondary | ICD-10-CM | POA: Diagnosis not present

## 2016-11-12 DIAGNOSIS — I10 Essential (primary) hypertension: Secondary | ICD-10-CM | POA: Diagnosis not present

## 2016-11-12 DIAGNOSIS — R2689 Other abnormalities of gait and mobility: Secondary | ICD-10-CM | POA: Diagnosis not present

## 2016-11-14 DIAGNOSIS — F319 Bipolar disorder, unspecified: Secondary | ICD-10-CM | POA: Diagnosis not present

## 2016-11-14 DIAGNOSIS — R2689 Other abnormalities of gait and mobility: Secondary | ICD-10-CM | POA: Diagnosis not present

## 2016-11-14 DIAGNOSIS — I7389 Other specified peripheral vascular diseases: Secondary | ICD-10-CM | POA: Diagnosis not present

## 2016-11-14 DIAGNOSIS — R296 Repeated falls: Secondary | ICD-10-CM | POA: Diagnosis not present

## 2016-11-14 DIAGNOSIS — I10 Essential (primary) hypertension: Secondary | ICD-10-CM | POA: Diagnosis not present

## 2016-11-14 DIAGNOSIS — F0390 Unspecified dementia without behavioral disturbance: Secondary | ICD-10-CM | POA: Diagnosis not present

## 2016-11-18 DIAGNOSIS — F0390 Unspecified dementia without behavioral disturbance: Secondary | ICD-10-CM | POA: Diagnosis not present

## 2016-11-18 DIAGNOSIS — R2689 Other abnormalities of gait and mobility: Secondary | ICD-10-CM | POA: Diagnosis not present

## 2016-11-18 DIAGNOSIS — R296 Repeated falls: Secondary | ICD-10-CM | POA: Diagnosis not present

## 2016-11-18 DIAGNOSIS — F319 Bipolar disorder, unspecified: Secondary | ICD-10-CM | POA: Diagnosis not present

## 2016-11-18 DIAGNOSIS — I10 Essential (primary) hypertension: Secondary | ICD-10-CM | POA: Diagnosis not present

## 2016-11-18 DIAGNOSIS — I7389 Other specified peripheral vascular diseases: Secondary | ICD-10-CM | POA: Diagnosis not present

## 2016-11-21 DIAGNOSIS — F319 Bipolar disorder, unspecified: Secondary | ICD-10-CM | POA: Diagnosis not present

## 2016-11-21 DIAGNOSIS — I10 Essential (primary) hypertension: Secondary | ICD-10-CM | POA: Diagnosis not present

## 2016-11-21 DIAGNOSIS — F0390 Unspecified dementia without behavioral disturbance: Secondary | ICD-10-CM | POA: Diagnosis not present

## 2016-11-21 DIAGNOSIS — I7389 Other specified peripheral vascular diseases: Secondary | ICD-10-CM | POA: Diagnosis not present

## 2016-11-21 DIAGNOSIS — R296 Repeated falls: Secondary | ICD-10-CM | POA: Diagnosis not present

## 2016-11-21 DIAGNOSIS — R2689 Other abnormalities of gait and mobility: Secondary | ICD-10-CM | POA: Diagnosis not present

## 2016-11-28 DIAGNOSIS — I10 Essential (primary) hypertension: Secondary | ICD-10-CM | POA: Diagnosis not present

## 2016-11-28 DIAGNOSIS — I7389 Other specified peripheral vascular diseases: Secondary | ICD-10-CM | POA: Diagnosis not present

## 2016-11-28 DIAGNOSIS — E785 Hyperlipidemia, unspecified: Secondary | ICD-10-CM | POA: Diagnosis not present

## 2016-11-28 DIAGNOSIS — F0390 Unspecified dementia without behavioral disturbance: Secondary | ICD-10-CM | POA: Diagnosis not present

## 2016-12-05 DIAGNOSIS — S60221A Contusion of right hand, initial encounter: Secondary | ICD-10-CM | POA: Diagnosis not present

## 2016-12-05 DIAGNOSIS — W19XXXA Unspecified fall, initial encounter: Secondary | ICD-10-CM | POA: Diagnosis not present

## 2016-12-05 DIAGNOSIS — M6281 Muscle weakness (generalized): Secondary | ICD-10-CM | POA: Diagnosis not present

## 2016-12-05 DIAGNOSIS — R269 Unspecified abnormalities of gait and mobility: Secondary | ICD-10-CM | POA: Diagnosis not present

## 2016-12-05 DIAGNOSIS — R54 Age-related physical debility: Secondary | ICD-10-CM | POA: Diagnosis not present

## 2016-12-12 DIAGNOSIS — R269 Unspecified abnormalities of gait and mobility: Secondary | ICD-10-CM | POA: Diagnosis not present

## 2016-12-12 DIAGNOSIS — Z9181 History of falling: Secondary | ICD-10-CM | POA: Diagnosis not present

## 2016-12-12 DIAGNOSIS — M6281 Muscle weakness (generalized): Secondary | ICD-10-CM | POA: Diagnosis not present

## 2016-12-12 DIAGNOSIS — R54 Age-related physical debility: Secondary | ICD-10-CM | POA: Diagnosis not present

## 2016-12-12 DIAGNOSIS — F0390 Unspecified dementia without behavioral disturbance: Secondary | ICD-10-CM | POA: Diagnosis not present

## 2016-12-24 DIAGNOSIS — M6281 Muscle weakness (generalized): Secondary | ICD-10-CM | POA: Diagnosis not present

## 2016-12-24 DIAGNOSIS — I739 Peripheral vascular disease, unspecified: Secondary | ICD-10-CM | POA: Diagnosis not present

## 2016-12-24 DIAGNOSIS — I10 Essential (primary) hypertension: Secondary | ICD-10-CM | POA: Diagnosis not present

## 2016-12-24 DIAGNOSIS — R2689 Other abnormalities of gait and mobility: Secondary | ICD-10-CM | POA: Diagnosis not present

## 2016-12-26 DIAGNOSIS — I10 Essential (primary) hypertension: Secondary | ICD-10-CM | POA: Diagnosis not present

## 2016-12-26 DIAGNOSIS — R2689 Other abnormalities of gait and mobility: Secondary | ICD-10-CM | POA: Diagnosis not present

## 2016-12-26 DIAGNOSIS — M6281 Muscle weakness (generalized): Secondary | ICD-10-CM | POA: Diagnosis not present

## 2016-12-26 DIAGNOSIS — I739 Peripheral vascular disease, unspecified: Secondary | ICD-10-CM | POA: Diagnosis not present

## 2016-12-30 DIAGNOSIS — R2689 Other abnormalities of gait and mobility: Secondary | ICD-10-CM | POA: Diagnosis not present

## 2016-12-30 DIAGNOSIS — I10 Essential (primary) hypertension: Secondary | ICD-10-CM | POA: Diagnosis not present

## 2016-12-30 DIAGNOSIS — I739 Peripheral vascular disease, unspecified: Secondary | ICD-10-CM | POA: Diagnosis not present

## 2016-12-30 DIAGNOSIS — M6281 Muscle weakness (generalized): Secondary | ICD-10-CM | POA: Diagnosis not present

## 2017-01-02 DIAGNOSIS — I10 Essential (primary) hypertension: Secondary | ICD-10-CM | POA: Diagnosis not present

## 2017-01-02 DIAGNOSIS — M6281 Muscle weakness (generalized): Secondary | ICD-10-CM | POA: Diagnosis not present

## 2017-01-02 DIAGNOSIS — R2689 Other abnormalities of gait and mobility: Secondary | ICD-10-CM | POA: Diagnosis not present

## 2017-01-02 DIAGNOSIS — I739 Peripheral vascular disease, unspecified: Secondary | ICD-10-CM | POA: Diagnosis not present

## 2017-01-03 DIAGNOSIS — Z23 Encounter for immunization: Secondary | ICD-10-CM | POA: Diagnosis not present

## 2017-01-06 DIAGNOSIS — I739 Peripheral vascular disease, unspecified: Secondary | ICD-10-CM | POA: Diagnosis not present

## 2017-01-06 DIAGNOSIS — I10 Essential (primary) hypertension: Secondary | ICD-10-CM | POA: Diagnosis not present

## 2017-01-06 DIAGNOSIS — M6281 Muscle weakness (generalized): Secondary | ICD-10-CM | POA: Diagnosis not present

## 2017-01-06 DIAGNOSIS — R2689 Other abnormalities of gait and mobility: Secondary | ICD-10-CM | POA: Diagnosis not present

## 2017-01-08 DIAGNOSIS — R2689 Other abnormalities of gait and mobility: Secondary | ICD-10-CM | POA: Diagnosis not present

## 2017-01-08 DIAGNOSIS — I10 Essential (primary) hypertension: Secondary | ICD-10-CM | POA: Diagnosis not present

## 2017-01-08 DIAGNOSIS — M6281 Muscle weakness (generalized): Secondary | ICD-10-CM | POA: Diagnosis not present

## 2017-01-08 DIAGNOSIS — I739 Peripheral vascular disease, unspecified: Secondary | ICD-10-CM | POA: Diagnosis not present

## 2017-01-13 DIAGNOSIS — M6281 Muscle weakness (generalized): Secondary | ICD-10-CM | POA: Diagnosis not present

## 2017-01-13 DIAGNOSIS — I739 Peripheral vascular disease, unspecified: Secondary | ICD-10-CM | POA: Diagnosis not present

## 2017-01-13 DIAGNOSIS — I10 Essential (primary) hypertension: Secondary | ICD-10-CM | POA: Diagnosis not present

## 2017-01-13 DIAGNOSIS — R2689 Other abnormalities of gait and mobility: Secondary | ICD-10-CM | POA: Diagnosis not present

## 2017-01-20 DIAGNOSIS — M6281 Muscle weakness (generalized): Secondary | ICD-10-CM | POA: Diagnosis not present

## 2017-01-20 DIAGNOSIS — R2689 Other abnormalities of gait and mobility: Secondary | ICD-10-CM | POA: Diagnosis not present

## 2017-01-20 DIAGNOSIS — I739 Peripheral vascular disease, unspecified: Secondary | ICD-10-CM | POA: Diagnosis not present

## 2017-01-20 DIAGNOSIS — I10 Essential (primary) hypertension: Secondary | ICD-10-CM | POA: Diagnosis not present

## 2017-01-27 DIAGNOSIS — I10 Essential (primary) hypertension: Secondary | ICD-10-CM | POA: Diagnosis not present

## 2017-01-27 DIAGNOSIS — R2689 Other abnormalities of gait and mobility: Secondary | ICD-10-CM | POA: Diagnosis not present

## 2017-01-27 DIAGNOSIS — I739 Peripheral vascular disease, unspecified: Secondary | ICD-10-CM | POA: Diagnosis not present

## 2017-01-27 DIAGNOSIS — M6281 Muscle weakness (generalized): Secondary | ICD-10-CM | POA: Diagnosis not present

## 2017-01-28 DIAGNOSIS — B351 Tinea unguium: Secondary | ICD-10-CM | POA: Diagnosis not present

## 2017-01-28 DIAGNOSIS — M79671 Pain in right foot: Secondary | ICD-10-CM | POA: Diagnosis not present

## 2017-02-03 DIAGNOSIS — R2689 Other abnormalities of gait and mobility: Secondary | ICD-10-CM | POA: Diagnosis not present

## 2017-02-03 DIAGNOSIS — I739 Peripheral vascular disease, unspecified: Secondary | ICD-10-CM | POA: Diagnosis not present

## 2017-02-03 DIAGNOSIS — I10 Essential (primary) hypertension: Secondary | ICD-10-CM | POA: Diagnosis not present

## 2017-02-03 DIAGNOSIS — M6281 Muscle weakness (generalized): Secondary | ICD-10-CM | POA: Diagnosis not present

## 2017-02-10 DIAGNOSIS — I739 Peripheral vascular disease, unspecified: Secondary | ICD-10-CM | POA: Diagnosis not present

## 2017-02-10 DIAGNOSIS — R2689 Other abnormalities of gait and mobility: Secondary | ICD-10-CM | POA: Diagnosis not present

## 2017-02-10 DIAGNOSIS — M6281 Muscle weakness (generalized): Secondary | ICD-10-CM | POA: Diagnosis not present

## 2017-02-10 DIAGNOSIS — I10 Essential (primary) hypertension: Secondary | ICD-10-CM | POA: Diagnosis not present

## 2017-02-18 DIAGNOSIS — R2689 Other abnormalities of gait and mobility: Secondary | ICD-10-CM | POA: Diagnosis not present

## 2017-02-18 DIAGNOSIS — M6281 Muscle weakness (generalized): Secondary | ICD-10-CM | POA: Diagnosis not present

## 2017-02-18 DIAGNOSIS — I739 Peripheral vascular disease, unspecified: Secondary | ICD-10-CM | POA: Diagnosis not present

## 2017-02-18 DIAGNOSIS — I10 Essential (primary) hypertension: Secondary | ICD-10-CM | POA: Diagnosis not present

## 2017-02-20 DIAGNOSIS — J449 Chronic obstructive pulmonary disease, unspecified: Secondary | ICD-10-CM | POA: Diagnosis not present

## 2017-02-20 DIAGNOSIS — N183 Chronic kidney disease, stage 3 (moderate): Secondary | ICD-10-CM | POA: Diagnosis not present

## 2017-02-20 DIAGNOSIS — F0390 Unspecified dementia without behavioral disturbance: Secondary | ICD-10-CM | POA: Diagnosis not present

## 2017-02-20 DIAGNOSIS — J309 Allergic rhinitis, unspecified: Secondary | ICD-10-CM | POA: Diagnosis not present

## 2017-02-20 DIAGNOSIS — I129 Hypertensive chronic kidney disease with stage 1 through stage 4 chronic kidney disease, or unspecified chronic kidney disease: Secondary | ICD-10-CM | POA: Diagnosis not present

## 2017-02-22 DIAGNOSIS — M6281 Muscle weakness (generalized): Secondary | ICD-10-CM | POA: Diagnosis not present

## 2017-02-22 DIAGNOSIS — I10 Essential (primary) hypertension: Secondary | ICD-10-CM | POA: Diagnosis not present

## 2017-02-22 DIAGNOSIS — I739 Peripheral vascular disease, unspecified: Secondary | ICD-10-CM | POA: Diagnosis not present

## 2017-02-22 DIAGNOSIS — R2689 Other abnormalities of gait and mobility: Secondary | ICD-10-CM | POA: Diagnosis not present

## 2017-02-24 DIAGNOSIS — Z79899 Other long term (current) drug therapy: Secondary | ICD-10-CM | POA: Diagnosis not present

## 2017-02-24 DIAGNOSIS — E119 Type 2 diabetes mellitus without complications: Secondary | ICD-10-CM | POA: Diagnosis not present

## 2017-02-24 DIAGNOSIS — I10 Essential (primary) hypertension: Secondary | ICD-10-CM | POA: Diagnosis not present

## 2017-02-24 DIAGNOSIS — I739 Peripheral vascular disease, unspecified: Secondary | ICD-10-CM | POA: Diagnosis not present

## 2017-02-24 DIAGNOSIS — R2689 Other abnormalities of gait and mobility: Secondary | ICD-10-CM | POA: Diagnosis not present

## 2017-02-24 DIAGNOSIS — E7849 Other hyperlipidemia: Secondary | ICD-10-CM | POA: Diagnosis not present

## 2017-02-24 DIAGNOSIS — D518 Other vitamin B12 deficiency anemias: Secondary | ICD-10-CM | POA: Diagnosis not present

## 2017-02-24 DIAGNOSIS — M6281 Muscle weakness (generalized): Secondary | ICD-10-CM | POA: Diagnosis not present

## 2017-03-03 DIAGNOSIS — I10 Essential (primary) hypertension: Secondary | ICD-10-CM | POA: Diagnosis not present

## 2017-03-03 DIAGNOSIS — I739 Peripheral vascular disease, unspecified: Secondary | ICD-10-CM | POA: Diagnosis not present

## 2017-03-03 DIAGNOSIS — R2689 Other abnormalities of gait and mobility: Secondary | ICD-10-CM | POA: Diagnosis not present

## 2017-03-03 DIAGNOSIS — M6281 Muscle weakness (generalized): Secondary | ICD-10-CM | POA: Diagnosis not present

## 2017-03-06 DIAGNOSIS — R0989 Other specified symptoms and signs involving the circulatory and respiratory systems: Secondary | ICD-10-CM | POA: Diagnosis not present

## 2017-03-06 DIAGNOSIS — F0390 Unspecified dementia without behavioral disturbance: Secondary | ICD-10-CM | POA: Diagnosis not present

## 2017-03-06 DIAGNOSIS — R05 Cough: Secondary | ICD-10-CM | POA: Diagnosis not present

## 2017-03-06 DIAGNOSIS — R1310 Dysphagia, unspecified: Secondary | ICD-10-CM | POA: Diagnosis not present

## 2017-03-07 DIAGNOSIS — R05 Cough: Secondary | ICD-10-CM | POA: Diagnosis not present

## 2017-03-07 DIAGNOSIS — I517 Cardiomegaly: Secondary | ICD-10-CM | POA: Diagnosis not present

## 2017-03-10 DIAGNOSIS — M6281 Muscle weakness (generalized): Secondary | ICD-10-CM | POA: Diagnosis not present

## 2017-03-10 DIAGNOSIS — I739 Peripheral vascular disease, unspecified: Secondary | ICD-10-CM | POA: Diagnosis not present

## 2017-03-10 DIAGNOSIS — R2689 Other abnormalities of gait and mobility: Secondary | ICD-10-CM | POA: Diagnosis not present

## 2017-03-10 DIAGNOSIS — Z79899 Other long term (current) drug therapy: Secondary | ICD-10-CM | POA: Diagnosis not present

## 2017-03-10 DIAGNOSIS — I10 Essential (primary) hypertension: Secondary | ICD-10-CM | POA: Diagnosis not present

## 2017-03-12 DIAGNOSIS — M6281 Muscle weakness (generalized): Secondary | ICD-10-CM | POA: Diagnosis not present

## 2017-03-12 DIAGNOSIS — I739 Peripheral vascular disease, unspecified: Secondary | ICD-10-CM | POA: Diagnosis not present

## 2017-03-12 DIAGNOSIS — R2689 Other abnormalities of gait and mobility: Secondary | ICD-10-CM | POA: Diagnosis not present

## 2017-03-12 DIAGNOSIS — I10 Essential (primary) hypertension: Secondary | ICD-10-CM | POA: Diagnosis not present

## 2017-03-18 DIAGNOSIS — I739 Peripheral vascular disease, unspecified: Secondary | ICD-10-CM | POA: Diagnosis not present

## 2017-03-18 DIAGNOSIS — R2689 Other abnormalities of gait and mobility: Secondary | ICD-10-CM | POA: Diagnosis not present

## 2017-03-18 DIAGNOSIS — M6281 Muscle weakness (generalized): Secondary | ICD-10-CM | POA: Diagnosis not present

## 2017-03-18 DIAGNOSIS — I10 Essential (primary) hypertension: Secondary | ICD-10-CM | POA: Diagnosis not present

## 2017-03-20 DIAGNOSIS — M6281 Muscle weakness (generalized): Secondary | ICD-10-CM | POA: Diagnosis not present

## 2017-03-20 DIAGNOSIS — I739 Peripheral vascular disease, unspecified: Secondary | ICD-10-CM | POA: Diagnosis not present

## 2017-03-20 DIAGNOSIS — R0989 Other specified symptoms and signs involving the circulatory and respiratory systems: Secondary | ICD-10-CM | POA: Diagnosis not present

## 2017-03-20 DIAGNOSIS — F0390 Unspecified dementia without behavioral disturbance: Secondary | ICD-10-CM | POA: Diagnosis not present

## 2017-03-20 DIAGNOSIS — R1310 Dysphagia, unspecified: Secondary | ICD-10-CM | POA: Diagnosis not present

## 2017-03-20 DIAGNOSIS — R05 Cough: Secondary | ICD-10-CM | POA: Diagnosis not present

## 2017-03-20 DIAGNOSIS — R2689 Other abnormalities of gait and mobility: Secondary | ICD-10-CM | POA: Diagnosis not present

## 2017-03-20 DIAGNOSIS — I10 Essential (primary) hypertension: Secondary | ICD-10-CM | POA: Diagnosis not present

## 2017-03-20 DIAGNOSIS — R54 Age-related physical debility: Secondary | ICD-10-CM | POA: Diagnosis not present

## 2017-03-21 DIAGNOSIS — R0602 Shortness of breath: Secondary | ICD-10-CM | POA: Diagnosis not present

## 2017-03-21 DIAGNOSIS — I517 Cardiomegaly: Secondary | ICD-10-CM | POA: Diagnosis not present

## 2017-03-21 DIAGNOSIS — R05 Cough: Secondary | ICD-10-CM | POA: Diagnosis not present

## 2017-03-25 DIAGNOSIS — R2689 Other abnormalities of gait and mobility: Secondary | ICD-10-CM | POA: Diagnosis not present

## 2017-03-25 DIAGNOSIS — I739 Peripheral vascular disease, unspecified: Secondary | ICD-10-CM | POA: Diagnosis not present

## 2017-03-25 DIAGNOSIS — M6281 Muscle weakness (generalized): Secondary | ICD-10-CM | POA: Diagnosis not present

## 2017-03-25 DIAGNOSIS — I10 Essential (primary) hypertension: Secondary | ICD-10-CM | POA: Diagnosis not present

## 2017-03-27 DIAGNOSIS — R269 Unspecified abnormalities of gait and mobility: Secondary | ICD-10-CM | POA: Diagnosis not present

## 2017-03-27 DIAGNOSIS — M6281 Muscle weakness (generalized): Secondary | ICD-10-CM | POA: Diagnosis not present

## 2017-03-27 DIAGNOSIS — R54 Age-related physical debility: Secondary | ICD-10-CM | POA: Diagnosis not present

## 2017-03-27 DIAGNOSIS — R1310 Dysphagia, unspecified: Secondary | ICD-10-CM | POA: Diagnosis not present

## 2017-03-27 DIAGNOSIS — I739 Peripheral vascular disease, unspecified: Secondary | ICD-10-CM | POA: Diagnosis not present

## 2017-03-27 DIAGNOSIS — J189 Pneumonia, unspecified organism: Secondary | ICD-10-CM | POA: Diagnosis not present

## 2017-03-27 DIAGNOSIS — S6992XA Unspecified injury of left wrist, hand and finger(s), initial encounter: Secondary | ICD-10-CM | POA: Diagnosis not present

## 2017-03-27 DIAGNOSIS — R2689 Other abnormalities of gait and mobility: Secondary | ICD-10-CM | POA: Diagnosis not present

## 2017-03-27 DIAGNOSIS — I10 Essential (primary) hypertension: Secondary | ICD-10-CM | POA: Diagnosis not present

## 2017-03-27 DIAGNOSIS — W06XXXA Fall from bed, initial encounter: Secondary | ICD-10-CM | POA: Diagnosis not present

## 2017-03-28 DIAGNOSIS — R05 Cough: Secondary | ICD-10-CM | POA: Diagnosis not present

## 2017-03-29 DIAGNOSIS — R404 Transient alteration of awareness: Secondary | ICD-10-CM | POA: Diagnosis not present

## 2017-03-29 DIAGNOSIS — R531 Weakness: Secondary | ICD-10-CM | POA: Diagnosis not present

## 2017-03-29 DIAGNOSIS — R4182 Altered mental status, unspecified: Secondary | ICD-10-CM | POA: Diagnosis not present

## 2017-03-29 DIAGNOSIS — N390 Urinary tract infection, site not specified: Secondary | ICD-10-CM | POA: Diagnosis not present

## 2017-03-29 DIAGNOSIS — I517 Cardiomegaly: Secondary | ICD-10-CM | POA: Diagnosis not present

## 2017-03-30 ENCOUNTER — Inpatient Hospital Stay (HOSPITAL_COMMUNITY)
Admission: AD | Admit: 2017-03-30 | Discharge: 2017-04-04 | DRG: 374 | Disposition: A | Discharge status: Hospice | Payer: Medicare Other | Source: Other Acute Inpatient Hospital | Attending: Internal Medicine | Admitting: Internal Medicine

## 2017-03-30 ENCOUNTER — Encounter (HOSPITAL_COMMUNITY): Payer: Self-pay | Admitting: Internal Medicine

## 2017-03-30 DIAGNOSIS — D600 Chronic acquired pure red cell aplasia: Secondary | ICD-10-CM | POA: Diagnosis not present

## 2017-03-30 DIAGNOSIS — R4182 Altered mental status, unspecified: Secondary | ICD-10-CM

## 2017-03-30 DIAGNOSIS — F0390 Unspecified dementia without behavioral disturbance: Secondary | ICD-10-CM | POA: Diagnosis not present

## 2017-03-30 DIAGNOSIS — N183 Chronic kidney disease, stage 3 unspecified: Secondary | ICD-10-CM | POA: Diagnosis present

## 2017-03-30 DIAGNOSIS — K5731 Diverticulosis of large intestine without perforation or abscess with bleeding: Secondary | ICD-10-CM | POA: Diagnosis present

## 2017-03-30 DIAGNOSIS — Z862 Personal history of diseases of the blood and blood-forming organs and certain disorders involving the immune mechanism: Secondary | ICD-10-CM | POA: Diagnosis not present

## 2017-03-30 DIAGNOSIS — F17200 Nicotine dependence, unspecified, uncomplicated: Secondary | ICD-10-CM | POA: Diagnosis present

## 2017-03-30 DIAGNOSIS — Z515 Encounter for palliative care: Secondary | ICD-10-CM | POA: Diagnosis not present

## 2017-03-30 DIAGNOSIS — N401 Enlarged prostate with lower urinary tract symptoms: Secondary | ICD-10-CM | POA: Diagnosis present

## 2017-03-30 DIAGNOSIS — C159 Malignant neoplasm of esophagus, unspecified: Principal | ICD-10-CM | POA: Diagnosis present

## 2017-03-30 DIAGNOSIS — R195 Other fecal abnormalities: Secondary | ICD-10-CM

## 2017-03-30 DIAGNOSIS — Z8719 Personal history of other diseases of the digestive system: Secondary | ICD-10-CM | POA: Diagnosis not present

## 2017-03-30 DIAGNOSIS — G9341 Metabolic encephalopathy: Secondary | ICD-10-CM | POA: Diagnosis present

## 2017-03-30 DIAGNOSIS — M1990 Unspecified osteoarthritis, unspecified site: Secondary | ICD-10-CM | POA: Diagnosis present

## 2017-03-30 DIAGNOSIS — R410 Disorientation, unspecified: Secondary | ICD-10-CM

## 2017-03-30 DIAGNOSIS — E785 Hyperlipidemia, unspecified: Secondary | ICD-10-CM | POA: Diagnosis present

## 2017-03-30 DIAGNOSIS — R339 Retention of urine, unspecified: Secondary | ICD-10-CM | POA: Diagnosis not present

## 2017-03-30 DIAGNOSIS — K229 Disease of esophagus, unspecified: Secondary | ICD-10-CM | POA: Diagnosis not present

## 2017-03-30 DIAGNOSIS — I10 Essential (primary) hypertension: Secondary | ICD-10-CM | POA: Diagnosis not present

## 2017-03-30 DIAGNOSIS — E870 Hyperosmolality and hypernatremia: Secondary | ICD-10-CM | POA: Diagnosis not present

## 2017-03-30 DIAGNOSIS — G250 Essential tremor: Secondary | ICD-10-CM | POA: Diagnosis not present

## 2017-03-30 DIAGNOSIS — J441 Chronic obstructive pulmonary disease with (acute) exacerbation: Secondary | ICD-10-CM | POA: Diagnosis not present

## 2017-03-30 DIAGNOSIS — Z8673 Personal history of transient ischemic attack (TIA), and cerebral infarction without residual deficits: Secondary | ICD-10-CM | POA: Diagnosis not present

## 2017-03-30 DIAGNOSIS — K922 Gastrointestinal hemorrhage, unspecified: Secondary | ICD-10-CM | POA: Diagnosis present

## 2017-03-30 DIAGNOSIS — F03A0 Unspecified dementia, mild, without behavioral disturbance, psychotic disturbance, mood disturbance, and anxiety: Secondary | ICD-10-CM | POA: Diagnosis present

## 2017-03-30 DIAGNOSIS — D62 Acute posthemorrhagic anemia: Secondary | ICD-10-CM | POA: Diagnosis not present

## 2017-03-30 DIAGNOSIS — F319 Bipolar disorder, unspecified: Secondary | ICD-10-CM | POA: Diagnosis present

## 2017-03-30 DIAGNOSIS — D72829 Elevated white blood cell count, unspecified: Secondary | ICD-10-CM | POA: Diagnosis not present

## 2017-03-30 DIAGNOSIS — Z8639 Personal history of other endocrine, nutritional and metabolic disease: Secondary | ICD-10-CM | POA: Diagnosis not present

## 2017-03-30 DIAGNOSIS — D500 Iron deficiency anemia secondary to blood loss (chronic): Secondary | ICD-10-CM | POA: Diagnosis present

## 2017-03-30 DIAGNOSIS — K219 Gastro-esophageal reflux disease without esophagitis: Secondary | ICD-10-CM | POA: Diagnosis present

## 2017-03-30 DIAGNOSIS — Z66 Do not resuscitate: Secondary | ICD-10-CM | POA: Diagnosis not present

## 2017-03-30 DIAGNOSIS — N390 Urinary tract infection, site not specified: Secondary | ICD-10-CM | POA: Diagnosis not present

## 2017-03-30 DIAGNOSIS — Z8669 Personal history of other diseases of the nervous system and sense organs: Secondary | ICD-10-CM | POA: Diagnosis not present

## 2017-03-30 DIAGNOSIS — R05 Cough: Secondary | ICD-10-CM | POA: Diagnosis not present

## 2017-03-30 DIAGNOSIS — R627 Adult failure to thrive: Secondary | ICD-10-CM | POA: Diagnosis present

## 2017-03-30 DIAGNOSIS — Z806 Family history of leukemia: Secondary | ICD-10-CM | POA: Diagnosis not present

## 2017-03-30 DIAGNOSIS — R0902 Hypoxemia: Secondary | ICD-10-CM

## 2017-03-30 DIAGNOSIS — R8290 Unspecified abnormal findings in urine: Secondary | ICD-10-CM | POA: Diagnosis present

## 2017-03-30 DIAGNOSIS — K3189 Other diseases of stomach and duodenum: Secondary | ICD-10-CM | POA: Diagnosis not present

## 2017-03-30 DIAGNOSIS — K264 Chronic or unspecified duodenal ulcer with hemorrhage: Secondary | ICD-10-CM | POA: Diagnosis present

## 2017-03-30 DIAGNOSIS — C160 Malignant neoplasm of cardia: Secondary | ICD-10-CM | POA: Diagnosis not present

## 2017-03-30 DIAGNOSIS — K920 Hematemesis: Secondary | ICD-10-CM | POA: Diagnosis present

## 2017-03-30 DIAGNOSIS — R29810 Facial weakness: Secondary | ICD-10-CM | POA: Diagnosis present

## 2017-03-30 DIAGNOSIS — R4781 Slurred speech: Secondary | ICD-10-CM | POA: Diagnosis not present

## 2017-03-30 DIAGNOSIS — R710 Precipitous drop in hematocrit: Secondary | ICD-10-CM

## 2017-03-30 DIAGNOSIS — Z6821 Body mass index (BMI) 21.0-21.9, adult: Secondary | ICD-10-CM

## 2017-03-30 DIAGNOSIS — D649 Anemia, unspecified: Secondary | ICD-10-CM

## 2017-03-30 DIAGNOSIS — Z85828 Personal history of other malignant neoplasm of skin: Secondary | ICD-10-CM | POA: Diagnosis not present

## 2017-03-30 DIAGNOSIS — Z8659 Personal history of other mental and behavioral disorders: Secondary | ICD-10-CM | POA: Diagnosis not present

## 2017-03-30 DIAGNOSIS — D72819 Decreased white blood cell count, unspecified: Secondary | ICD-10-CM

## 2017-03-30 DIAGNOSIS — Z87438 Personal history of other diseases of male genital organs: Secondary | ICD-10-CM | POA: Diagnosis not present

## 2017-03-30 DIAGNOSIS — R338 Other retention of urine: Secondary | ICD-10-CM | POA: Diagnosis present

## 2017-03-30 DIAGNOSIS — K228 Other specified diseases of esophagus: Secondary | ICD-10-CM | POA: Diagnosis present

## 2017-03-30 DIAGNOSIS — K2289 Other specified disease of esophagus: Secondary | ICD-10-CM | POA: Diagnosis present

## 2017-03-30 DIAGNOSIS — E860 Dehydration: Secondary | ICD-10-CM | POA: Diagnosis not present

## 2017-03-30 DIAGNOSIS — J9811 Atelectasis: Secondary | ICD-10-CM | POA: Diagnosis not present

## 2017-03-30 DIAGNOSIS — Z8679 Personal history of other diseases of the circulatory system: Secondary | ICD-10-CM | POA: Diagnosis not present

## 2017-03-30 HISTORY — DX: Anxiety disorder, unspecified: F41.9

## 2017-03-30 HISTORY — DX: Benign prostatic hyperplasia without lower urinary tract symptoms: N40.0

## 2017-03-30 HISTORY — DX: Essential tremor: G25.0

## 2017-03-30 HISTORY — DX: Gastro-esophageal reflux disease without esophagitis: K21.9

## 2017-03-30 HISTORY — DX: Depression, unspecified: F32.A

## 2017-03-30 HISTORY — DX: Metabolic encephalopathy: G93.41

## 2017-03-30 HISTORY — DX: Iron deficiency anemia secondary to blood loss (chronic): D50.0

## 2017-03-30 HISTORY — DX: Major depressive disorder, single episode, unspecified: F32.9

## 2017-03-30 HISTORY — DX: Diverticulosis of intestine, part unspecified, without perforation or abscess without bleeding: K57.90

## 2017-03-30 HISTORY — DX: Essential (primary) hypertension: I10

## 2017-03-30 HISTORY — DX: Cerebral infarction, unspecified: I63.9

## 2017-03-30 HISTORY — DX: Mild cognitive impairment of uncertain or unknown etiology: G31.84

## 2017-03-30 HISTORY — DX: Unspecified osteoarthritis, unspecified site: M19.90

## 2017-03-30 HISTORY — DX: Endocarditis, valve unspecified: I38

## 2017-03-30 HISTORY — DX: Pneumonia, unspecified organism: J18.9

## 2017-03-30 HISTORY — DX: Hyperlipidemia, unspecified: E78.5

## 2017-03-30 HISTORY — DX: Unspecified malignant neoplasm of skin, unspecified: C44.90

## 2017-03-30 HISTORY — DX: Diverticulosis of large intestine without perforation or abscess with bleeding: K57.31

## 2017-03-30 HISTORY — DX: Bipolar disorder, unspecified: F31.9

## 2017-03-30 LAB — COMPREHENSIVE METABOLIC PANEL
ALT: 16 U/L — AB (ref 17–63)
AST: 27 U/L (ref 15–41)
Albumin: 2.3 g/dL — ABNORMAL LOW (ref 3.5–5.0)
Alkaline Phosphatase: 148 U/L — ABNORMAL HIGH (ref 38–126)
Anion gap: 6 (ref 5–15)
BUN: 38 mg/dL — ABNORMAL HIGH (ref 6–20)
CHLORIDE: 114 mmol/L — AB (ref 101–111)
CO2: 20 mmol/L — AB (ref 22–32)
Calcium: 8.8 mg/dL — ABNORMAL LOW (ref 8.9–10.3)
Creatinine, Ser: 1.83 mg/dL — ABNORMAL HIGH (ref 0.61–1.24)
GFR, EST AFRICAN AMERICAN: 36 mL/min — AB (ref >60)
GFR, EST NON AFRICAN AMERICAN: 31 mL/min — AB (ref >60)
Glucose, Bld: 111 mg/dL — ABNORMAL HIGH (ref 65–99)
POTASSIUM: 4.3 mmol/L (ref 3.5–5.1)
SODIUM: 140 mmol/L (ref 135–145)
Total Bilirubin: 0.6 mg/dL (ref 0.3–1.2)
Total Protein: 5.9 g/dL — ABNORMAL LOW (ref 6.5–8.1)

## 2017-03-30 LAB — HEMOGLOBIN AND HEMATOCRIT, BLOOD
HCT: 28.3 % — ABNORMAL LOW (ref 39.0–52.0)
Hemoglobin: 8.6 g/dL — ABNORMAL LOW (ref 13.0–17.0)

## 2017-03-30 LAB — URINALYSIS, ROUTINE W REFLEX MICROSCOPIC
BACTERIA UA: NONE SEEN
BILIRUBIN URINE: NEGATIVE
GLUCOSE, UA: NEGATIVE mg/dL
HGB URINE DIPSTICK: NEGATIVE
KETONES UR: NEGATIVE mg/dL
NITRITE: NEGATIVE
PROTEIN: NEGATIVE mg/dL
Specific Gravity, Urine: 1.005 (ref 1.005–1.030)
Squamous Epithelial / LPF: NONE SEEN
pH: 6 (ref 5.0–8.0)

## 2017-03-30 LAB — CBC WITH DIFFERENTIAL/PLATELET
BASOS ABS: 0 10*3/uL (ref 0.0–0.1)
Basophils Relative: 0 %
Eosinophils Absolute: 0.1 10*3/uL (ref 0.0–0.7)
Eosinophils Relative: 1 %
HCT: 23.1 % — ABNORMAL LOW (ref 39.0–52.0)
Hemoglobin: 6.9 g/dL — CL (ref 13.0–17.0)
LYMPHS ABS: 0.8 10*3/uL (ref 0.7–4.0)
Lymphocytes Relative: 6 %
MCH: 25.2 pg — ABNORMAL LOW (ref 26.0–34.0)
MCHC: 29.9 g/dL — ABNORMAL LOW (ref 30.0–36.0)
MCV: 84.3 fL (ref 78.0–100.0)
MONO ABS: 0.9 10*3/uL (ref 0.1–1.0)
MONOS PCT: 7 %
NEUTROS ABS: 11.1 10*3/uL — AB (ref 1.7–7.7)
Neutrophils Relative %: 86 %
PLATELETS: 405 10*3/uL — AB (ref 150–400)
RBC: 2.74 MIL/uL — AB (ref 4.22–5.81)
RDW: 15.6 % — AB (ref 11.5–15.5)
WBC: 12.9 10*3/uL — AB (ref 4.0–10.5)

## 2017-03-30 LAB — PREPARE RBC (CROSSMATCH)

## 2017-03-30 LAB — ABO/RH: ABO/RH(D): A NEG

## 2017-03-30 LAB — MRSA PCR SCREENING: MRSA BY PCR: POSITIVE — AB

## 2017-03-30 MED ORDER — ACETAMINOPHEN 325 MG PO TABS: 650.0000 mg | ORAL_TABLET | Freq: Four times a day (QID) | ORAL | Status: DC | PRN

## 2017-03-30 MED ORDER — ACETAMINOPHEN 650 MG RE SUPP: 650.0000 mg | Freq: Four times a day (QID) | RECTAL | Status: DC | PRN

## 2017-03-30 MED ORDER — ONDANSETRON HCL 4 MG/2ML IJ SOLN: 4.0000 mg | Freq: Four times a day (QID) | INTRAMUSCULAR | Status: DC | PRN

## 2017-03-30 MED ORDER — SODIUM CHLORIDE 0.9 % IV SOLN
Freq: Once | INTRAVENOUS | Status: AC
  Administered 2017-03-30: 11:00:00 via INTRAVENOUS

## 2017-03-30 MED ORDER — HALOPERIDOL LACTATE 5 MG/ML IJ SOLN
1.0000 mg | Freq: Once | INTRAMUSCULAR | Status: AC
  Administered 2017-03-30: 1 mg via INTRAVENOUS
  Filled 2017-03-30: qty 1

## 2017-03-30 MED ORDER — CEFTRIAXONE SODIUM 1 G IJ SOLR
1.0000 g | Freq: Every day | INTRAMUSCULAR | Status: DC
  Administered 2017-03-31 – 2017-04-01 (×2): 1 g via INTRAVENOUS
  Filled 2017-03-30 (×3): qty 10

## 2017-03-30 MED ORDER — FUROSEMIDE 10 MG/ML IJ SOLN
20.0000 mg | Freq: Once | INTRAMUSCULAR | Status: AC
  Administered 2017-03-30: 20 mg via INTRAVENOUS
  Filled 2017-03-30: qty 2

## 2017-03-30 MED ORDER — POTASSIUM CHLORIDE IN NACL 20-0.9 MEQ/L-% IV SOLN
INTRAVENOUS | Status: DC
  Administered 2017-03-30: 14:00:00 via INTRAVENOUS
  Filled 2017-03-30 (×2): qty 1000

## 2017-03-30 MED ORDER — MORPHINE SULFATE (PF) 2 MG/ML IV SOLN: 1.0000 mg | INTRAVENOUS | Status: DC | PRN

## 2017-03-30 MED ORDER — SODIUM CHLORIDE 0.9% FLUSH
3.0000 mL | Freq: Two times a day (BID) | INTRAVENOUS | Status: DC
  Administered 2017-03-30 – 2017-04-03 (×7): 3 mL via INTRAVENOUS

## 2017-03-30 NOTE — H&P (Signed)
History and Physical    Guthrie Lemme PPI:[id number] DOB: 03-21-1926 DOA: 03/30/2017  PCP: Townsend Roger, MD   Patient coming from: [facility] skilled nursing facility and [place]  I have personally briefly reviewed patient's old medical records in [place]  Chief Complaint: To [facility] from [place] home due to altered, shaking, twitching more than normal with slurred speech.  Patient rambling which is not normal for him.  This occurred last night.  HPI: Paul Costa is a 83 y.o. male with medical history significant of diverticulosis with hemorrhage, chronic blood loss anemia, mild dementia, urinary retention chronic indwelling Foley, hypertension, hyperlipidemia, gastroesophageal reflux, essential tremor, depression, prior stroke, bipolar disorder on lithium who resents to our facility in transfer from [facility].  Presented to [facility] last night with confusion and not acting right altered shaking and twitching.  Evaluation in the emergency department revealed a markedly low hemoglobin of 7.2.  (It was 14.2 on most recent CBC from a year ago).  He had a serum creatinine of 2.2 showing a acute on chronic kidney injury 1 year ago his labs were normal.  Urinalysis was also abnormal and a Foley catheter was placed with greater than 1 L of urine output.  Digital rectal examination revealed brown stool which was fecal occult blood positive.  She given IV fluid and levofloxacin was typed and screened for 1 unit of packed red blood cells and transferred to our facility due to no GI physician available there for the next 3 or 4 days. On evaluation on arrival to our hospital the patient is nonverbal does respond to deep stimulation but not able to give any history and does not open his eyes.  All information is obtained from the medical record from [facility], transfer communication and patient's nursing home record  Review of Systems: As per HPI otherwise  10 point review of systems negative.   Review of Systems  Unable to perform ROS: Mental status change      Past Medical History:  Diagnosis Date  . Anxiety   . Bipolar disorder ([place])    On lithium in the past  . Blood loss anemia   . CVA (cerebral vascular accident) ([place])   . Depression   . Diverticulosis   . Diverticulosis of large intestine with hemorrhage   . Essential tremor   . Gastroesophageal reflux   . Hyperlipidemia   . Hypertension   . Metabolic encephalopathy   . Mild cognitive impairment   . Osteoarthritis   . Pneumonia   . Prostatic hypertrophy   . Skin cancer   . Valvular heart disease     Past Surgical History:  Procedure Laterality Date  . COLONOSCOPY     Dr. Lyda Jester  . Hip surgery Left    ORIF  . TRANSURETHRAL RESECTION OF PROSTATE       reports that he has been smoking.  he has never used smokeless tobacco. He reports that he does not drink alcohol or use drugs.  Per records from [place] not on file  Family History  Problem Relation Age of Onset  . Leukemia Father      Prior to Admission medications   Not on File    Physical Exam: Vitals:   03/30/17 1051 03/30/17 1120  BP: (!) 151/72 (!) 142/67  Pulse: 72 68  Resp: 16 (!) 22  Temp: 97.8 F (36.6 C) 97.8 F (36.6 C)  TempSrc: Axillary Axillary  SpO2: 96% 98%  Constitutional: NAD, calm, comfortable Vitals:   03/30/17 1051 03/30/17 1120  BP: (!) 151/72 (!) 142/67  Pulse: 72 68  Resp: 16 (!) 22  Temp: 97.8 F (36.6 C) 97.8 F (36.6 C)  TempSrc: Axillary Axillary  SpO2: 96% 98%   Eyes: PERRL, lids and conjunctivae normal ENMT: Mucous membranes are moist. Posterior pharynx clear of any exudate or lesions.Normal dentition.  Neck: normal, supple, no masses, no thyromegaly Respiratory: clear to auscultation bilaterally, no wheezing, no crackles. Normal respiratory effort. No accessory muscle use.  Cardiovascular: Regular rate and rhythm, no murmurs /  rubs / gallops. No extremity edema. 2+ pedal pulses. No carotid bruits.  Abdomen: Tender to palpation with moaning no other body tenderness appreciated, no masses palpated. No hepatosplenomegaly. Bowel sounds positive.  Musculoskeletal: no clubbing / cyanosis. No joint deformity upper and lower extremities. Good ROM, no contractures. Normal muscle tone.  Skin: no rashes, lesions, ulcers. No induration Neurologic: CN 2-12 grossly intact. Sensation intact, DTR normal. Strength 5/5 in all 4.  Psychiatric: Normal judgment and insight. Alert and oriented x 3. Normal mood.     Labs on Admission: I have personally reviewed following labs and imaging studies  CBC: Recent Labs  Lab 03/30/17 0704  WBC 12.9*  NEUTROABS 11.1*  HGB 6.9*  HCT 23.1*  MCV 84.3  PLT 268*   Basic Metabolic Panel: Recent Labs  Lab 03/30/17 0704  NA 140  K 4.3  CL 114*  CO2 20*  GLUCOSE 111*  BUN 38*  CREATININE 1.83*  CALCIUM 8.8*   GFR: CrCl cannot be calculated (Unknown ideal weight.). Liver Function Tests: Recent Labs  Lab 03/30/17 0704  AST 27  ALT 16*  ALKPHOS 148*  BILITOT 0.6  PROT 5.9*  ALBUMIN 2.3*   No results for input(s): LIPASE, AMYLASE in the last 168 hours. No results for input(s): AMMONIA in the last 168 hours.   Below noted labs obtained from transfer documentation from [facility].  Lactic acid 1.0  Coagulation Profile: PTT: 34.3 seconds Prothrombin time 10.8 seconds INR 1.1 No results for input(s): INR, PROTIME in the last 168 hours. Cardiac Enzymes: Troponin less than 0.0  No results for input(s): CKTOTAL, CKMB, CKMBINDEX, TROPONINI in the last 168 hours. BNP (last 3 results) No results for input(s): PROBNP in the last 8760 hours. HbA1C: No results for input(s): HGBA1C in the last 72 hours. CBG: No results for input(s): GLUCAP in the last 168 hours. Lipid Profile: No results for input(s): CHOL, HDL, LDLCALC, TRIG, CHOLHDL, LDLDIRECT in the last 72  hours. Thyroid Function Tests: No results for input(s): TSH, T4TOTAL, FREET4, T3FREE, THYROIDAB in the last 72 hours. Anemia Panel: No results for input(s): VITAMINB12, FOLATE, FERRITIN, TIBC, IRON, RETICCTPCT in the last 72 hours. Urine analysis:    Component Value Date/Time   COLORURINE COLORLESS (A) 03/30/2017 1032   APPEARANCEUR CLEAR 03/30/2017 1032   LABSPEC 1.005 03/30/2017 1032   PHURINE 6.0 03/30/2017 1032   GLUCOSEU NEGATIVE 03/30/2017 1032   HGBUR NEGATIVE 03/30/2017 1032   BILIRUBINUR NEGATIVE 03/30/2017 1032   KETONESUR NEGATIVE 03/30/2017 1032   PROTEINUR NEGATIVE 03/30/2017 1032   NITRITE NEGATIVE 03/30/2017 1032   LEUKOCYTESUR TRACE (A) 03/30/2017 1032   Lithium level 1.10  Radiological Exams on Admission: Chest x-ray from [facility]: Mild left basilar subsegmental atelectasis and/or scarring.  No other active cardiopulmonary disease.  Stable cardiomegaly without pulmonary edema. CT scan of head: No acute intracranial pathology.  Mild age-related atrophy and chronic microvascular ischemic changes.  EKG: Independently reviewed.N/A  Assessment/Plan Principal Problem:   Acute on chronic blood loss anemia (HCC) Active Problems:   Acute GI bleeding   Acute metabolic encephalopathy   CKD (chronic kidney disease), stage III (HCC)   Urinary retention   Abnormal urinalysis   Mild dementia   1.  Acute on chronic blood loss anemia: Patient with a history of prior GI bleeding and anemia associated with diverticulosis now presents with very low hemoglobin and heme positive stools.  No large bloody movements have been noted at the facility.  The patient is confused and not acting himself and appears to be due to his acute metabolic encephalopathy.  Transfuse 1 unit of packed red blood cells and follow H&H closely.  Ideally patient should have a hemoglobin greater than 8.  No prior coronary disease is noted.  2.  Acute GI bleeding: I have spoken to Dr. Arelia Longest  on-call for GI.  He has accepted to see the patient in consultation.  Further care regarding GI bleeding per his evaluation.  3.  Acute metabolic encephalopathy: Patient is confused and not responding as he is normally described in the medical record.  Hopefully with transfusion and treatment his mental status will improve.  4.  Chronic kidney disease stage III: Creatinine has improved since transfer.  We will continue to monitor.  May require further treatment by nephrology if renal function worsens.  5.  Urinary retention: May have exacerbated his chronic kidney disease stage III.  We will continue to monitor and continue Foley catheter.  Would recommend outpatient evaluation by urology once patient discharge.  6.  Abnormal urinalysis: I have personally reviewed the urinalysis and patient was started on antibiotics but at this point I doubt that his culture will grow anything out.  If a culture was sent from [facility] I would recommend calling there and getting the report.  Although there was no nitrite or leukocyte esterase which definitely lowers the likelihood of actual infection.  Would discontinue antibiotics once culture negative  7.  Mild dementia: Noted an outpatient with metabolic encephalopathy and mentation is significantly worse.      DVT prophylaxis: Lovenox Code Status: DNR Family Communication: No family present. Disposition Plan: Likely back to skilled facility in several days Consults called: Dr. Arelia Longest from gastroenterology Admission status: Inpatient  Spent 75 minutes in care coordination for this patient. Lady Deutscher MD FACP [facility] Pager [phone] If 7PM-7AM, please contact night-coverage [email or web address] Password [email or web address]  03/30/2017, 12:10 PM

## 2017-03-30 NOTE — Progress Notes (Signed)
CRITICAL VALUE ALERT  Critical Value:  Hemoglobin 6.9 (received call from [facility] in hematology lab)  Date & Time Notied:  03/30/2017 0817  Provider Notified: 03/30/2017 0819 Dr. Evangeline Gula  Orders Received/Actions taken: Order verified for patient to receive 1 unit PRBCs. Will continue to monitor. Bartholomew Crews, RN

## 2017-03-30 NOTE — Consult Note (Addendum)
 Referring Provider: [facility] Primary Care Physician:  No primary care provider on file. Primary Gastroenterologist:  unassigned  Reason for Consultation:  anemia  ASSESSMENT AND PLAN:   1. 83 yo male with dementia transferred from [facility] ED with increasing confusion, anemia, heme positive stools.  Hgb down 6-7 grams from labs a year ago. Per Son, nursing home concerned about possible episode of hematemesis a week or so ago.  -No melena on DRE. I spoke with son about wishes for workup. Plan is to reassess patient tomorrow for possible EGD. Son agrees to EGD. Given age and deteriorated condition patient poor candidate for workup beyond that and the son agrees.   -after swallowing study today patient can eat. I made him NPO after MN  2. Altered mental status. Mumbing / confusion getting progressively worse over last several days per son. Patient has some facial drooping.  -Awaiting Admitting Team for admission so will let them assess neuro status.   3. Leukocytosis. Apparently u/a was suspicious at [facility]. Urine in foley looks clear. He is getting rocephin    [facility] Attending   I have taken an interval history, reviewed the chart and examined the patient. I agree with the Advanced Practitioner's note, impression and recommendations.    Additionally - it is not entirely clear what is going on beyond that we know he has anemia - ,ay have had some GI bleeding but no hemorrhage evident on exam now.  We will reassess tomorrow - will consider doing an EGD and have gotten consent from son but we do not have good definitive evidence of a GI hemorrhage, just very circumstantial evidence and definitely no major active bleeding. He is elderly and frail and at higher risk of problems after complications and higher risk of problems with sedation also.  I suspect his R facial droop is most likely chronmic but if mental status changes (decreased LOC) are not better after supportive care  could need further evaluation of this.  Carl E. Gessner, MD, FACG [facility] [phone] (pager) 03/30/2017 12:16 PM   HPI: Paul Costa is a 83 y.o. male transferred from [facility] ED for GI bleed.  Patient can't provide hx, he has dementia and bipolar disorder.  He is difficult to keep awake and only mumbles when he is forced to stay alert. Patient presented to [facility] ED from the nursing home.  He was having increased confusion, found to be febrile in the ED with a white count of 14,000.  Hemoglobin 7.2, apparently down from 14 on CBC a year ago. Medication review pending but I didn't see he was on any NSAIDS in chart from [place] Stool was Hemoccult positive. Urinalysis was suspicious for infection and antibiotics were started  Here at [facility], WBC today is 12.9, hemoglobin 6.9, MCV 84.3.  BUN 38, creatinine 1.8 3.  Alk phos 148, transaminases are normal, albumin 2.3, total bili is normal.  Since patient unable to provide hx I spoke on phone to son Steve. Two weeks ago patient vomited what appeared to be blood x1 episode. Appetite has been fine.  No other GI complaints. Per son, patient has been coming increasing confused and unable to communicate over the last several days.     PMH: Dementia, bipolar disorder, HTN, GERD, possible prostate cancer, hx of CVA   Prior to Admission medications   Not on File  pending  Current Facility-Administered Medications  Medication Dose Route Frequency Provider Last Rate Last Dose  . 0.9 %  sodium   chloride infusion   Intravenous Once Sheehan, Theresa C, MD      . 0.9 % NaCl with KCl 20 mEq/ L  infusion   Intravenous Continuous Sheehan, Theresa C, MD      . acetaminophen (TYLENOL) tablet 650 mg  650 mg Oral Q6H PRN Sheehan, Theresa C, MD       Or  . acetaminophen (TYLENOL) suppository 650 mg  650 mg Rectal Q6H PRN Sheehan, Theresa C, MD      . cefTRIAXone (ROCEPHIN) 1 g in dextrose 5 % 50 mL IVPB  1 g Intravenous Daily Sheehan, Theresa  C, MD      . morphine 2 MG/ML injection 1-3 mg  1-3 mg Intravenous Q4H PRN Opyd, Timothy S, MD      . sodium chloride flush (NS) 0.9 % injection 3 mL  3 mL Intravenous Q12H Sheehan, Theresa C, MD   3 mL at 03/30/17 1027    Allergies as of 03/30/2017  . (Not on File)    Family History  Problem Relation Age of Onset  . Leukemia Father    No FMH of colon cancer per son  Social History   Socioeconomic History  . Marital status: Married                                         Occupational History  . Not on file  Tobacco Use  . Smoking status: Smoker, Current Status Unknown  . Smokeless tobacco: Never Used  Substance and Sexual Activity  . Alcohol use: No    Frequency: Never  . Drug use: No  . Sexual activity: Not Currently        Social History Narrative   Lives in skilled nursing facility in [place]    Review of Systems: Unable to obtain due to decreased LOC and dementia  Physical Exam: Vital signs in last 24 hours: Temp:  [97.8 F (36.6 C)] 97.8 F (36.6 C) (12/23 1120) Pulse Rate:  [68-72] 68 (12/23 1120) Resp:  [16-22] 22 (12/23 1120) BP: (142-151)/(67-72) 142/67 (12/23 1120) SpO2:  [96 %-98 %] 98 % (12/23 1120)  Last BM Date: 03/29/17 General:   Sleepy, well-developed, male in NAD Psych:  Can't assess Eyes:  Pupils equal, sclera clear, no icterus.   Conjunctiva pink. Ears:  Normal auditory acuity. Nose:  No deformity, discharge,  or lesions. Neck:  Supple; no masses Lungs:  Clear throughout to auscultation.   No wheezes, crackles, or rhonchi.  Heart:  Regular rate and rhythm; no murmurs, no edema Abdomen:  Soft, non-distended, nontender, BS active, no palp mass    Rectal:  Scant light brownish yellow stool in vault  Msk:  Symmetrical without gross deformities. . Neurologic:  Sleepy but arousable. Right mouth droop. Mumbling CN's seem intact o/w and he follows commands, can wiggle toes and weakly symmetrically squeeze my fingers Carl E. Gessner,  MD, FACG  Skin:  Intact without significant lesions or rashes..  Intake/Output this shift: Total I/O In: 30 [Blood:30] Out: 750 [Urine:750]  Lab Results: Recent Labs    03/30/17 0704  WBC 12.9*  HGB 6.9*  HCT 23.1*  PLT 405*   BMET Recent Labs    03/30/17 0704  NA 140  K 4.3  CL 114*  CO2 20*  GLUCOSE 111*  BUN 38*  CREATININE 1.83*  CALCIUM 8.8*   LFT Recent Labs    03/30/17 0704    PROT 5.9*  ALBUMIN 2.3*  AST 27  ALT 16*  ALKPHOS 148*  BILITOT 0.6   Studies/Results: No results found.  Paula Guenther, NP-C @  03/30/2017, 12:11 PM  Pager number [phone]   

## 2017-03-30 NOTE — Progress Notes (Signed)
New Admission Note:   Arrival Method:   [place] from [place] Orientation:  Unable to assess - Moaning, eyes closed, agitated when moving Telemetry:   None Assessment: Completed Skin:  Bilateral Arm Bruising IV:  Rt AC NSL Pain:  Tubes:  None Safety Measures: Safety Fall Prevention Plan has been given, discussed and signed Admission: Completed [facility] [place] Orientation: Patient has been orientated to the room, unit and staff.  Family:  Per [place] - Son, [name], gave permission for transfer to [facility] and for transfusion of one unit PRBC.  His number is [phone]  Patient's belongings placed in closet.  [facility] called and made aware of patient's arrival to the hospital.  Orders have been reviewed and implemented. Will continue to monitor the patient. Call light has been placed within reach and bed alarm has been activated.   Earleen Reaper RN- [place], [place] Phone number: [phone]

## 2017-03-30 NOTE — Plan of Care (Signed)
Pt Paul Costa (MRN [id number]) is accepted to telemetry bed at [facility] from [place] (EDP Dr. Odie Sera) for GI bleeding.  He is a 45 yom with hx of dementia, bipolar disorder, and HLD, presenting from nursing home with increased confusion.  Found to be afebrile with normal vitals. WBC 14,300, Hgb 7.2 (14.2 on most recent CBC from a yr ago), SCr is 2.2 (1 a year ago), labs otherwise unremarkable.  UA looked infected. Foley placed with >1 L out. DRE with brown stool FOBT+.  Treated with IVF and Levaquin. Typed and screened with one unit ordered there. They don't have GI, so their hospitalist recommended admission here.  Full H&P to follow.

## 2017-03-30 NOTE — H&P (View-Only) (Signed)
Referring Provider: [facility] Primary Care Physician:  No primary care provider on file. Primary Gastroenterologist:  unassigned  Reason for Consultation:  anemia  ASSESSMENT AND PLAN:   42. 83 yo male with dementia transferred from [facility] ED with increasing confusion, anemia, heme positive stools.  Hgb down 6-7 grams from labs a year ago. Per Son, nursing home concerned about possible episode of hematemesis a week or so ago.  -No melena on DRE. I spoke with son about wishes for workup. Plan is to reassess patient tomorrow for possible EGD. Son agrees to EGD. Given age and deteriorated condition patient poor candidate for workup beyond that and the son agrees.   -after swallowing study today patient can eat. I made him NPO after MN  2. Altered mental status. Mumbing / confusion getting progressively worse over last several days per son. Patient has some facial drooping.  -Awaiting Admitting Team for admission so will let them assess neuro status.   3. Leukocytosis. Apparently u/a was suspicious at [facility] [place]. Urine in foley looks clear. He is getting rocephin    [facility] Attending   I have taken an interval history, reviewed the chart and examined the patient. I agree with the Advanced Practitioner's note, impression and recommendations.    Additionally - it is not entirely clear what is going on beyond that we know he has anemia - ,ay have had some GI bleeding but no hemorrhage evident on exam now.  We will reassess tomorrow - will consider doing an EGD and have gotten consent from son but we do not have good definitive evidence of a GI hemorrhage, just very circumstantial evidence and definitely no major active bleeding. He is elderly and frail and at higher risk of problems after complications and higher risk of problems with sedation also.  I suspect his R facial droop is most likely chronmic but if mental status changes (decreased LOC) are not better after supportive care  could need further evaluation of this.  Gatha Mayer, MD, [facility] [phone] (pager) 03/30/2017 12:16 PM   HPI: Paul Costa is a 83 y.o. male transferred from [facility] ED for GI bleed.  Patient can't provide hx, he has dementia and bipolar disorder.  He is difficult to keep awake and only mumbles when he is forced to stay alert. Patient presented to [facility] ED from the nursing home.  He was having increased confusion, found to be febrile in the ED with a white count of 14,000.  Hemoglobin 7.2, apparently down from 14 on CBC a year ago. Medication review pending but I didn't see he was on any NSAIDS in chart from [place] was Hemoccult positive. Urinalysis was suspicious for infection and antibiotics were started  Here at [facility], WBC today is 12.9, hemoglobin 6.9, MCV 84.3.  BUN 38, creatinine 1.8 3.  Alk phos 148, transaminases are normal, albumin 2.3, total bili is normal.  Since patient unable to provide hx I spoke on phone to son Richardson Landry. Two weeks ago patient vomited what appeared to be blood x1 episode. Appetite has been fine.  No other GI complaints. Per son, patient has been coming increasing confused and unable to communicate over the last several days.     PMH: Dementia, bipolar disorder, HTN, GERD, possible prostate cancer, hx of CVA   Prior to Admission medications   Not on File  pending  Current Facility-Administered Medications  Medication Dose Route Frequency Provider Last Rate Last Dose  . 0.9 %  sodium  chloride infusion   Intravenous Once Lady Deutscher, MD      . 0.9 % NaCl with KCl 20 mEq/ L  infusion   Intravenous Continuous Lady Deutscher, MD      . acetaminophen (TYLENOL) tablet 650 mg  650 mg Oral Q6H PRN Lady Deutscher, MD       Or  . acetaminophen (TYLENOL) suppository 650 mg  650 mg Rectal Q6H PRN Lady Deutscher, MD      . cefTRIAXone (ROCEPHIN) 1 g in dextrose 5 % 50 mL IVPB  1 g Intravenous Daily Lady Deutscher, MD      . morphine 2 MG/ML injection 1-3 mg  1-3 mg Intravenous Q4H PRN Opyd, Ilene Qua, MD      . sodium chloride flush (NS) 0.9 % injection 3 mL  3 mL Intravenous Q12H Lady Deutscher, MD   3 mL at 03/30/17 1027    Allergies as of 03/30/2017  . (Not on File)    Family History  Problem Relation Age of Onset  . Leukemia Father    No [place] of colon cancer per son  Social History   Socioeconomic History  . Marital status: Married                                         Occupational History  . Not on file  Tobacco Use  . Smoking status: Smoker, Current Status Unknown  . Smokeless tobacco: Never Used  Substance and Sexual Activity  . Alcohol use: No    Frequency: Never  . Drug use: No  . Sexual activity: Not Currently        Social History Narrative   Lives in skilled nursing facility in [place]    Review of Systems: Unable to obtain due to decreased LOC and dementia  Physical Exam: Vital signs in last 24 hours: Temp:  [97.8 F (36.6 C)] 97.8 F (36.6 C) (12/23 1120) Pulse Rate:  [68-72] 68 (12/23 1120) Resp:  [16-22] 22 (12/23 1120) BP: (142-151)/(67-72) 142/67 (12/23 1120) SpO2:  [96 %-98 %] 98 % (12/23 1120)  Last BM Date: 03/29/17 General:   Sleepy, well-developed, male in NAD Psych:  Can't assess Eyes:  Pupils equal, sclera clear, no icterus.   Conjunctiva pink. Ears:  Normal auditory acuity. Nose:  No deformity, discharge,  or lesions. Neck:  Supple; no masses Lungs:  Clear throughout to auscultation.   No wheezes, crackles, or rhonchi.  Heart:  Regular rate and rhythm; no murmurs, no edema Abdomen:  Soft, non-distended, nontender, BS active, no palp mass    Rectal:  Scant light brownish yellow stool in vault  Msk:  Symmetrical without gross deformities. . Neurologic:  Sleepy but arousable. Right mouth droop. Mumbling CN's seem intact o/w and he follows commands, can wiggle toes and weakly symmetrically squeeze my fingers Gatha Mayer,  MD, [facility]  Skin:  Intact without significant lesions or rashes..  Intake/Output this shift: Total I/O In: 30 [Blood:30] Out: 750 [Urine:750]  Lab Results: Recent Labs    03/30/17 0704  WBC 12.9*  HGB 6.9*  HCT 23.1*  PLT 405*   BMET Recent Labs    03/30/17 0704  NA 140  K 4.3  CL 114*  CO2 20*  GLUCOSE 111*  BUN 38*  CREATININE 1.83*  CALCIUM 8.8*   LFT Recent Labs    03/30/17 0704  PROT 5.9*  ALBUMIN 2.3*  AST 27  ALT 16*  ALKPHOS 148*  BILITOT 0.6   Studies/Results: No results found.  Tye Savoy, NP-C @  03/30/2017, 12:11 PM  Pager number [phone]

## 2017-03-31 ENCOUNTER — Encounter (HOSPITAL_COMMUNITY): Payer: Self-pay

## 2017-03-31 ENCOUNTER — Inpatient Hospital Stay (HOSPITAL_COMMUNITY): Payer: Medicare Other

## 2017-03-31 ENCOUNTER — Encounter (HOSPITAL_COMMUNITY)
Admission: AD | Disposition: A | Discharge status: Hospice | Payer: Self-pay | Source: Other Acute Inpatient Hospital | Attending: Internal Medicine

## 2017-03-31 ENCOUNTER — Inpatient Hospital Stay (HOSPITAL_COMMUNITY): Payer: Medicare Other | Admitting: Anesthesiology

## 2017-03-31 DIAGNOSIS — K920 Hematemesis: Secondary | ICD-10-CM | POA: Diagnosis present

## 2017-03-31 DIAGNOSIS — K229 Disease of esophagus, unspecified: Secondary | ICD-10-CM

## 2017-03-31 DIAGNOSIS — F0390 Unspecified dementia without behavioral disturbance: Secondary | ICD-10-CM

## 2017-03-31 DIAGNOSIS — N183 Chronic kidney disease, stage 3 (moderate): Secondary | ICD-10-CM

## 2017-03-31 DIAGNOSIS — R339 Retention of urine, unspecified: Secondary | ICD-10-CM

## 2017-03-31 DIAGNOSIS — K2289 Other specified disease of esophagus: Secondary | ICD-10-CM | POA: Diagnosis present

## 2017-03-31 DIAGNOSIS — K228 Other specified diseases of esophagus: Secondary | ICD-10-CM | POA: Diagnosis present

## 2017-03-31 DIAGNOSIS — G9341 Metabolic encephalopathy: Secondary | ICD-10-CM

## 2017-03-31 DIAGNOSIS — C160 Malignant neoplasm of cardia: Secondary | ICD-10-CM

## 2017-03-31 DIAGNOSIS — D72829 Elevated white blood cell count, unspecified: Secondary | ICD-10-CM

## 2017-03-31 HISTORY — PX: ESOPHAGOGASTRODUODENOSCOPY (EGD) WITH PROPOFOL: SHX5813

## 2017-03-31 LAB — CBC
HCT: 30.7 % — ABNORMAL LOW (ref 39.0–52.0)
HEMOGLOBIN: 9.1 g/dL — AB (ref 13.0–17.0)
MCH: 25.3 pg — AB (ref 26.0–34.0)
MCHC: 29.6 g/dL — AB (ref 30.0–36.0)
MCV: 85.3 fL (ref 78.0–100.0)
PLATELETS: 421 10*3/uL — AB (ref 150–400)
RBC: 3.6 MIL/uL — ABNORMAL LOW (ref 4.22–5.81)
RDW: 15.3 % (ref 11.5–15.5)
WBC: 13.8 10*3/uL — ABNORMAL HIGH (ref 4.0–10.5)

## 2017-03-31 LAB — BPAM RBC
BLOOD PRODUCT EXPIRATION DATE: 201812312359
ISSUE DATE / TIME: 201812231054
Unit Type and Rh: 600

## 2017-03-31 LAB — BASIC METABOLIC PANEL
ANION GAP: 9 (ref 5–15)
BUN: 35 mg/dL — ABNORMAL HIGH (ref 6–20)
CALCIUM: 9.2 mg/dL (ref 8.9–10.3)
CO2: 21 mmol/L — AB (ref 22–32)
CREATININE: 1.62 mg/dL — AB (ref 0.61–1.24)
Chloride: 118 mmol/L — ABNORMAL HIGH (ref 101–111)
GFR, EST AFRICAN AMERICAN: 42 mL/min — AB (ref >60)
GFR, EST NON AFRICAN AMERICAN: 36 mL/min — AB (ref >60)
GLUCOSE: 109 mg/dL — AB (ref 65–99)
Potassium: 4.1 mmol/L (ref 3.5–5.1)
Sodium: 148 mmol/L — ABNORMAL HIGH (ref 135–145)

## 2017-03-31 LAB — URINE CULTURE: CULTURE: NO GROWTH

## 2017-03-31 LAB — LITHIUM LEVEL: LITHIUM LVL: 0.7 mmol/L (ref 0.60–1.20)

## 2017-03-31 LAB — TYPE AND SCREEN
ABO/RH(D): A NEG
Antibody Screen: NEGATIVE
Unit division: 0

## 2017-03-31 SURGERY — ESOPHAGOGASTRODUODENOSCOPY (EGD) WITH PROPOFOL
Anesthesia: Monitor Anesthesia Care

## 2017-03-31 MED ORDER — GADOBENATE DIMEGLUMINE 529 MG/ML IV SOLN
10.0000 mL | Freq: Once | INTRAVENOUS | Status: AC
  Administered 2017-03-31: 10 mL via INTRAVENOUS

## 2017-03-31 MED ORDER — HALOPERIDOL LACTATE 5 MG/ML IJ SOLN: 1.0000 mg | Freq: Four times a day (QID) | INTRAMUSCULAR | Status: DC | PRN

## 2017-03-31 MED ORDER — DEXTROSE 5 % IV SOLN
INTRAVENOUS | Status: DC
  Administered 2017-03-31 – 2017-04-02 (×4): via INTRAVENOUS

## 2017-03-31 MED ORDER — SODIUM CHLORIDE 0.9 % IV SOLN
INTRAVENOUS | Status: DC | PRN
  Administered 2017-03-31: 12:00:00 via INTRAVENOUS

## 2017-03-31 MED ORDER — DEXTROSE 5 % IV SOLN: INTRAVENOUS | Status: DC

## 2017-03-31 MED ORDER — LORAZEPAM 2 MG/ML IJ SOLN: 0.5000 mg | Freq: Once | INTRAMUSCULAR | Status: DC

## 2017-03-31 MED ORDER — LITHIUM CARBONATE ER 450 MG PO TBCR
450.0000 mg | EXTENDED_RELEASE_TABLET | Freq: Every day | ORAL | Status: DC
  Administered 2017-03-31 – 2017-04-02 (×3): 450 mg via ORAL
  Filled 2017-03-31 (×5): qty 1

## 2017-03-31 MED ORDER — PROPOFOL 10 MG/ML IV BOLUS
INTRAVENOUS | Status: DC | PRN
  Administered 2017-03-31: 20 mg via INTRAVENOUS
  Administered 2017-03-31: 30 mg via INTRAVENOUS
  Administered 2017-03-31: 20 mg via INTRAVENOUS
  Administered 2017-03-31: 10 mg via INTRAVENOUS
  Administered 2017-03-31: 20 mg via INTRAVENOUS

## 2017-03-31 MED ORDER — MUPIROCIN 2 % EX OINT
1.0000 "application " | TOPICAL_OINTMENT | Freq: Two times a day (BID) | CUTANEOUS | Status: DC
  Administered 2017-03-31 – 2017-04-03 (×7): 1 via NASAL
  Filled 2017-03-31: qty 22

## 2017-03-31 MED ORDER — CHLORHEXIDINE GLUCONATE CLOTH 2 % EX PADS
6.0000 | MEDICATED_PAD | Freq: Every day | CUTANEOUS | Status: DC
  Administered 2017-03-31 – 2017-04-03 (×4): 6 via TOPICAL

## 2017-03-31 MED ORDER — LORAZEPAM 2 MG/ML IJ SOLN
0.5000 mg | Freq: Two times a day (BID) | INTRAMUSCULAR | Status: DC | PRN
  Administered 2017-03-31: 0.5 mg via INTRAVENOUS
  Filled 2017-03-31: qty 1

## 2017-03-31 SURGICAL SUPPLY — 14 items

## 2017-03-31 NOTE — Transfer of Care (Signed)
Immediate Anesthesia Transfer of Care Note  Patient: Paul Costa  Procedure(s) Performed: ESOPHAGOGASTRODUODENOSCOPY (EGD) WITH PROPOFOL (N/A )  Patient Location: Endoscopy Unit  Anesthesia Type:MAC  Level of Consciousness: sedated and patient cooperative  Airway & Oxygen Therapy: Patient Spontanous Breathing  Post-op Assessment: Report given to RN and Post -op Vital signs reviewed and stable  Post vital signs: Reviewed and stable  Last Vitals:  Vitals:   03/31/17 0700 03/31/17 1135  BP: (!) 160/91 (!) 186/82  Pulse: 79 75  Resp: 20 17  Temp: 36.8 C 37.1 C  SpO2: 100% 100%    Last Pain:  Vitals:   03/31/17 1135  TempSrc: Oral         Complications: No apparent anesthesia complications

## 2017-03-31 NOTE — Interval H&P Note (Signed)
History and Physical Interval Note:  03/31/2017 11:52 AM  Paul Costa  has presented today for surgery, with the diagnosis of anemia  The various methods of treatment have been discussed with the patient and family. After consideration of risks, benefits and other options for treatment, the patient has consented to  Procedure(s): ESOPHAGOGASTRODUODENOSCOPY (EGD) WITH PROPOFOL (N/A) as a surgical intervention .  The patient's history has been reviewed, patient examined, no change in status, stable for surgery.  I have reviewed the patient's chart and labs.  Questions were answered to the patient's satisfaction.    I saw this patient at approximately 7AM today and deemed him able to undergo the EGD today. I evaluated him again in the pre-op area and discussed EGD with his son Remo Lipps.  Nelida Meuse III

## 2017-03-31 NOTE — Evaluation (Signed)
Physical Therapy Evaluation Patient Details Name: Paul Costa MRN: [id number] DOB: 08-Oct-1925 Today's Date: 03/31/2017   History of Present Illness  83 y.o.malewith medical history significant ofdiverticulosis with hemorrhage, chronic blood loss anemia, mild dementia, urinary retention chronic indwelling Foley, hypertension, hyperlipidemia, gastroesophageal reflux, essential tremor, depression, prior stroke, bipolar disorder on lithium who presents from nursing home for acute onset confusion.  Clinical Impression  Orders received for PT evaluation. Patient demonstrates deficits in functional mobility as indicated below. Will benefit from continued skilled PT to address deficits and maximize function. Will see as indicated and progress as tolerated.      Follow Up Recommendations SNF;Supervision/Assistance - 24 hour    Equipment Recommendations  None recommended by PT    Recommendations for Other Services       Precautions / Restrictions Precautions Precautions: Fall Restrictions Weight Bearing Restrictions: No      Mobility  Bed Mobility Overal bed mobility: Needs Assistance Bed Mobility: Rolling;Supine to Sit;Sit to Supine Rolling: Min assist   Supine to sit: Mod assist Sit to supine: Min assist   General bed mobility comments: moderate assist to elevate trunk to upright and rotate hips to EOB, min assist to reposition upon return to supine  Transfers Overall transfer level: Needs assistance Equipment used: 2 person hand held assist Transfers: Sit to/from Stand Sit to Stand: +2 physical assistance;Mod assist         General transfer comment: Moderate assist of two to stand from bed  Ambulation/Gait Ambulation/Gait assistance: Mod assist;Max assist;+2 physical assistance Ambulation Distance (Feet): 16 Feet Assistive device: 2 person hand held assist(wrap around support with gait belt) Gait Pattern/deviations: Step-to pattern;Decreased stride  length;Shuffle;Trunk flexed Gait velocity: decreased Gait velocity interpretation: Below normal speed for age/gender General Gait Details: LLE buckling during ambulation, poor ability to maintain upright moderate to max assist when buck.ling occurs. Poor stride and placement of bilateral LEs  Stairs            Wheelchair Mobility    Modified Rankin (Stroke Patients Only)       Balance Overall balance assessment: Needs assistance   Sitting balance-Leahy Scale: Fair Sitting balance - Comments: able to sit EOB with min guard     Standing balance-Leahy Scale: Poor Standing balance comment: required bilateral UE support and assist to maintain static standing                             Pertinent Vitals/Pain Pain Assessment: Faces Faces Pain Scale: Hurts little more Pain Location: left shoulder Pain Descriptors / Indicators: Sore Pain Intervention(s): Monitored during session    Home Living Family/patient expects to be discharged to:: Skilled nursing facility                      Prior Function Level of Independence: Needs assistance         Comments: patient a resident at SNF     Hand Dominance        Extremity/Trunk Assessment   Upper Extremity Assessment Upper Extremity Assessment: Generalized weakness;RUE deficits/detail;LUE deficits/detail;Difficult to assess due to impaired cognition RUE Coordination: decreased fine motor;decreased gross motor(noted resting tremor) LUE Coordination: decreased fine motor;decreased gross motor(noted resting tremor)    Lower Extremity Assessment Lower Extremity Assessment: Generalized weakness;RLE deficits/detail;LLE deficits/detail;Difficult to assess due to impaired cognition RLE Coordination: decreased fine motor;decreased gross motor LLE Deficits / Details: noted functional weakness/ buckling assymertrical compared to right LLE Coordination: decreased fine motor;decreased  gross motor     Cervical / Trunk Assessment Cervical / Trunk Assessment: Kyphotic  Communication   Communication: Expressive difficulties  Cognition Arousal/Alertness: Awake/alert Behavior During Therapy: Restless Overall Cognitive Status: History of cognitive impairments - at baseline                                 General Comments: patient is alert and oriented to self, and [place] but not to situration, time or specific place. Patient follows commands consistently throughout session but does require some increased time      General Comments      Exercises     Assessment/Plan    PT Assessment Patient needs continued PT services  PT Problem List Decreased strength;Decreased activity tolerance;Decreased balance;Decreased mobility;Decreased coordination;Decreased cognition       PT Treatment Interventions DME instruction;Gait training;Functional mobility training;Therapeutic activities;Therapeutic exercise;Balance training;Neuromuscular re-education;Patient/family education    PT Goals (Current goals can be found in the Care Plan section)  Acute Rehab PT Goals Patient Stated Goal: none stated PT Goal Formulation: Patient unable to participate in goal setting Time For Goal Achievement: 03/25/2017 Potential to Achieve Goals: Fair    Frequency Min 2X/week   Barriers to discharge        Co-evaluation PT/OT/SLP Co-Evaluation/Treatment: Yes Reason for Co-Treatment: Complexity of the patient's impairments (multi-system involvement);Necessary to address cognition/behavior during functional activity;For patient/therapist safety PT goals addressed during session: Mobility/safety with mobility;Balance OT goals addressed during session: ADL's and self-care       AM-PAC PT "6 Clicks" Daily Activity  Outcome Measure Difficulty turning over in bed (including adjusting bedclothes, sheets and blankets)?: Unable Difficulty moving from lying on back to sitting on the side of the  bed? : Unable Difficulty sitting down on and standing up from a chair with arms (e.g., wheelchair, bedside commode, etc,.)?: Unable Help needed moving to and from a bed to chair (including a wheelchair)?: A Lot Help needed walking in hospital room?: A Lot Help needed climbing 3-5 steps with a railing? : Total 6 Click Score: 8    End of Session Equipment Utilized During Treatment: Gait belt Activity Tolerance: Patient tolerated treatment well Patient left: in bed;with call bell/phone within reach;with bed alarm set(XRay technician in room; mitts applied ) Nurse Communication: Mobility status PT Visit Diagnosis: Muscle weakness (generalized) (M62.81);Ataxic gait (R26.0);Other symptoms and signs involving the nervous system (R29.898)    Time: 6045-4098 PT Time Calculation (min) (ACUTE ONLY): 18 min   Charges:   PT Evaluation $PT Eval Moderate Complexity: 1 Mod     PT G Codes:        Alben Deeds, PT DPT  Board Certified Neurologic Specialist [phone]   Duncan Dull 03/31/2017, 9:43 AM

## 2017-03-31 NOTE — Progress Notes (Signed)
Patient became increasingly agitated, calling out, and trying to climb out of the bed.  Unable to redirect.  [facility] called and made aware.  Rec'd order for Haldol 1mg  IV which was given at 2120 with minimal results.  Patient continued to attempted to pull Foley out.  Safety Mittens placed on both hands.  Will continue to monitor patient.  Earleen Reaper RN-BC, [employer]

## 2017-03-31 NOTE — Anesthesia Preprocedure Evaluation (Signed)
Anesthesia Evaluation  Patient identified by MRN, date of birth, ID band Patient unresponsive    Reviewed: Allergy & Precautions, NPO status , Patient's Chart, lab work & pertinent test results  Airway Mallampati: III  TM Distance: >3 FB Neck ROM: Full    Dental  (+) Dental Advisory Given   Pulmonary neg pulmonary ROS,    Pulmonary exam normal        Cardiovascular hypertension, Normal cardiovascular exam + Systolic murmurs    Neuro/Psych PSYCHIATRIC DISORDERS Anxiety Depression Bipolar Disorder CVA negative neurological ROS     GI/Hepatic Neg liver ROS, GERD  ,  Endo/Other  negative endocrine ROS  Renal/GU Renal InsufficiencyRenal diseasenegative Renal ROS  negative genitourinary   Musculoskeletal negative musculoskeletal ROS (+)   Abdominal   Peds negative pediatric ROS (+)  Hematology negative hematology ROS (+)   Anesthesia Other Findings   Reproductive/Obstetrics negative OB ROS                             Anesthesia Physical Anesthesia Plan  ASA: III  Anesthesia Plan: MAC   Post-op Pain Management:    Induction:   PONV Risk Score and Plan: 1 and Propofol infusion  Airway Management Planned: Natural Airway  Additional Equipment:   Intra-op Plan:   Post-operative Plan:   Informed Consent: I have reviewed the patients History and Physical, chart, labs and discussed the procedure including the risks, benefits and alternatives for the proposed anesthesia with the patient or authorized representative who has indicated his/her understanding and acceptance.   Dental advisory given  Plan Discussed with: Anesthesiologist and CRNA  Anesthesia Plan Comments:         Anesthesia Quick Evaluation

## 2017-03-31 NOTE — Progress Notes (Signed)
Rec'd laboratory notification that PCR for MRSA was positive.  MRSA Protocol implemented.  Will continue to monitor patient.  Earleen Reaper RN-BC, [employer]

## 2017-03-31 NOTE — Anesthesia Postprocedure Evaluation (Signed)
Anesthesia Post Note  Patient: Paul Costa  Procedure(s) Performed: ESOPHAGOGASTRODUODENOSCOPY (EGD) WITH PROPOFOL (N/A )     Patient location during evaluation: Endoscopy Anesthesia Type: MAC Level of consciousness: lethargic Pain management: pain level controlled Vital Signs Assessment: post-procedure vital signs reviewed and stable Respiratory status: spontaneous breathing and respiratory function stable Cardiovascular status: stable Postop Assessment: no apparent nausea or vomiting Anesthetic complications: no    Last Vitals:  Vitals:   03/31/17 1235 03/31/17 1258  BP: (!) 150/52 (!) 174/72  Pulse: 60 70  Resp: 19 18  Temp:  37 C  SpO2: 98% 98%    Last Pain:  Vitals:   03/31/17 1258  TempSrc: Oral                 Mourad Cwikla DANIEL

## 2017-03-31 NOTE — Op Note (Signed)
[facility] Patient Name: Paul Costa Procedure Date : 03/31/2017 MRN: [id number] Attending MD: Estill Cotta. Loletha Carrow , MD Date of Birth: 08/01/1925 CSN: [id number] Age: 83 Admit Type: Inpatient Procedure:                Upper GI endoscopy Indications:              Unexplained anemia, Hematemesis (single episode at                            nursing home about 2 weeks PTA) Providers:                Estill Cotta. Loletha Carrow, MD, Kingsley Plan, RN, Cletis Athens, Technician, Lance Coon, CRNA Referring MD:              Medicines:                Monitored Anesthesia Care Complications:            No immediate complications. Estimated Blood Loss:     Estimated blood loss was minimal. Procedure:                Pre-Anesthesia Assessment:                           - Prior to the procedure, a History and Physical                            was performed, and patient medications and                            allergies were reviewed. The patient's tolerance of                            previous anesthesia was also reviewed. The risks                            and benefits of the procedure and the sedation                            options and risks were discussed with the patient.                            All questions were answered, and informed consent                            was obtained. Prior Anticoagulants: The patient has                            taken aspirin, last dose was 3 days prior to                            procedure. ASA Grade Assessment: III - A patient  with severe systemic disease. After reviewing the                            risks and benefits, the patient was deemed in                            satisfactory condition to undergo the procedure.                           After obtaining informed consent, the endoscope was                            passed under direct vision. Throughout the               procedure, the patient's blood pressure, pulse, and                            oxygen saturations were monitored continuously. The                            EG-[id number] ([id number]) scope was introduced through the                            mouth, and advanced to the second part of duodenum.                            The upper GI endoscopy was accomplished without                            difficulty. The patient tolerated the procedure                            well. Scope In: Scope Out: Findings:      The larynx was normal.      A large, fungating mass with no bleeding and stigmata of recent bleeding       was found at the gastroesophageal junction, which was at 40cm from the       incisors. The mass was partially obstructing and circumferential,       extending into the gastric cardia. Biopsies were taken with a cold       forceps for histology (Jar 1).      There was a separate, raised and friable patch of tissue several       centimeters proximal to the EGJ mass. It was separately biopsied as Jar       2.      A small amount of Hematin (altered blood/coffee-ground-like material)       was found in the gastric body. The EGJ mass was seen extending into the       gastric carida on retroflexion.      A few erosions were found in the duodenal bulb.      The exam of the duodenum was otherwise normal. Impression:               - Normal larynx.                           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 Biopsied.                           - Hematin (altered blood/coffee-ground-like                            material) in the gastric body.                           - Duodenal erosions from aspirin. Moderate Sedation:      MAC sedation used Recommendation:           - Pureed diet.                           - Await pathology results.                            - Considering this patient's advanced age, dementia                            and poor condition, a conservative approach is                            warranted.                           - Discontinue aspirin indefinitely. Procedure Code(s):        --- Professional ---                           [phone], Esophagogastroduodenoscopy, flexible,                            transoral; with biopsy, single or multiple Diagnosis Code(s):        --- Professional ---                           D49.0, Neoplasm of unspecified behavior of                            digestive system                           K92.2, Gastrointestinal hemorrhage, unspecified                           D50.9, Iron deficiency anemia, unspecified                           K92.0, Hematemesis CPT copyright 2016 American Medical Association. All rights reserved. The codes documented in this report are preliminary and upon coder review may  be  revised to meet current compliance requirements. [name], MD 03/31/2017 12:24:29 PM This report has been signed electronically. Number of Addenda: 0

## 2017-03-31 NOTE — Anesthesia Procedure Notes (Signed)
Procedure Name: MAC Date/Time: 03/31/2017 11:58 AM Performed by: Lance Coon, CRNA Pre-anesthesia Checklist: Emergency Drugs available, Suction available, Patient being monitored, Timeout performed and Patient identified Patient Re-evaluated:Patient Re-evaluated prior to induction Oxygen Delivery Method: Nasal cannula

## 2017-03-31 NOTE — Progress Notes (Signed)
PROGRESS NOTE    Paul Costa  VQQ:[id number] DOB: [birth year] DOA: 03/30/2017 PCP: Patient, No Pcp Per    Brief Narrative:  Patient is a 83 year old gentleman history of diverticulosis with hemorrhage, chronic blood loss, mild dementia, urinary retention with chronic indwelling Foley catheter, hypertension, GERD, essential tremors, bipolar disorder on lithium presents from [facility] due to worsening confusion with some associated shaking and twitching.  Patient noted to have a hemoglobin of 7.2 and a creatinine of 2.2.  Urine cultures pending.  FOBT was positive.  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  Biopsies pending.   Assessment & Plan:   Principal Problem:   Acute on chronic blood loss anemia (HCC) Active Problems:   Acute GI bleeding   CKD (chronic kidney disease), stage III (HCC)   Urinary retention   Abnormal urinalysis   Mild dementia   Acute metabolic encephalopathy   Hematemesis   Esophageal mass  #1 acute on chronic blood loss anemia Likely secondary to esophageal mass noted on upper endoscopy and duodenal erosions.  Patient status post 1 unit packed red blood cells.  Hemoglobin currently at 9.1 from 6.9.  Patient status post upper endoscopy with esophageal mass which was partially obstructing noted.  Follow H&H.  2.  Partially obstructing esophageal tumor Per upper endoscopy 03/31/2017.  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  Biopsies taken.  Patient with advanced age, poor condition.  Per GI likely conservative approach is warranted at this time.  Continue current pured diet.  Will consult with palliative care.  GI following and appreciate their input and recommendations.  3.  Acute metabolic encephalopathy Questionable etiology.  May be secondary to problems number 1 day 2.  CT head unremarkable.  Check MRI of the head to rule out metastatic disease.  Urine  cultures pending.  Chest x-ray done negative for any acute infiltrate.  Patient empirically on IV Rocephin.  Follow for now.  Ativan as needed.  4.  Mild dementia Ativan as needed.  Will also add Haldol as needed for severe agitation.  5.  Chronic kidney disease stage III Stable.  6.  Urinary retention Continue chronic indwelling Foley catheter.  Outpatient follow-up with urology on discharge.  7.  Mild dementia Outpatient follow-up.  Patient's mentation has worsened.  Infectious workup underway.  Continue IV Rocephin.  Follow.  8.  Leukocytosis Likely reactive leukocytosis.  Urine cultures pending.  Chest x-ray negative for any acute infiltrate.  Continue empiric IV Rocephin.     DVT prophylaxis: SCDs Code Status: DNR Family Communication:No family at bedside Disposition Plan: To be determined.   Consultants:   Gastroenterology: Dr Loletha Carrow III 03/31/2017  Procedures:   CT head 03/31/2017  Upper endoscopy 03/31/2017 per Dr.Danis III  Chest x-ray 03/31/2017  Transfused 1 unit packed red blood cells 03/30/2017  Antimicrobials:   IV Rocephin 03/30/2017   Subjective: Just returned from procedure and agitated and confused.  Patient singing.  Patient not following commands.  Objective: Vitals:   03/31/17 1225 03/31/17 1235 03/31/17 1258 03/31/17 1500  BP: (!) 155/61 (!) 150/52 (!) 174/72 (!) 170/96  Pulse: 60 60 70 74  Resp: (!) 22 19 18 18   Temp:   98.6 F (37 C) 98.7 F (37.1 C)  TempSrc:   Oral Oral  SpO2: 99% 98% 98% 98%  Weight:      Height:        Intake/Output Summary (Last 24 hours) at 03/31/2017 4332  Last data filed at 03/31/2017 1800 Gross per 24 hour  Intake 2210 ml  Output 3150 ml  Net -940 ml   Filed Weights   03/30/17 2014 03/31/17 1135  Weight: 70.3 kg (155 lb) 70.3 kg (155 lb)    Examination:  General exam: Confused.  Somewhat agitated. Respiratory system: Clear to auscultation anterior lung fields. Respiratory effort  normal. Cardiovascular system: S1 & S2 heard, RRR. No JVD, murmurs, rubs, gallops or clicks. No pedal edema. Gastrointestinal system: Abdomen is nondistended, soft and nontender. No organomegaly or masses felt. Normal bowel sounds heard. Central nervous system: Alert and oriented. No focal neurological deficits. Extremities: Symmetric 5 x 5 power. Skin: No rashes, lesions or ulcers Psychiatry: Judgement and insight appear poor. Mood & affect appropriate.     Data Reviewed: I have personally reviewed following labs and imaging studies  CBC: Recent Labs  Lab 03/30/17 0704 03/30/17 1830 03/31/17 0558  WBC 12.9*  --  13.8*  NEUTROABS 11.1*  --   --   HGB 6.9* 8.6* 9.1*  HCT 23.1* 28.3* 30.7*  MCV 84.3  --  85.3  PLT 405*  --  053*   Basic Metabolic Panel: Recent Labs  Lab 03/30/17 0704 03/31/17 0558  NA 140 148*  K 4.3 4.1  CL 114* 118*  CO2 20* 21*  GLUCOSE 111* 109*  BUN 38* 35*  CREATININE 1.83* 1.62*  CALCIUM 8.8* 9.2   GFR: Estimated Creatinine Clearance: 30.7 mL/min (A) (by C-G formula based on SCr of 1.62 mg/dL (H)). Liver Function Tests: Recent Labs  Lab 03/30/17 0704  AST 27  ALT 16*  ALKPHOS 148*  BILITOT 0.6  PROT 5.9*  ALBUMIN 2.3*   No results for input(s): LIPASE, AMYLASE in the last 168 hours. No results for input(s): AMMONIA in the last 168 hours. Coagulation Profile: No results for input(s): INR, PROTIME in the last 168 hours. Cardiac Enzymes: No results for input(s): CKTOTAL, CKMB, CKMBINDEX, TROPONINI in the last 168 hours. BNP (last 3 results) No results for input(s): PROBNP in the last 8760 hours. HbA1C: No results for input(s): HGBA1C in the last 72 hours. CBG: No results for input(s): GLUCAP in the last 168 hours. Lipid Profile: No results for input(s): CHOL, HDL, LDLCALC, TRIG, CHOLHDL, LDLDIRECT in the last 72 hours. Thyroid Function Tests: No results for input(s): TSH, T4TOTAL, FREET4, T3FREE, THYROIDAB in the last 72  hours. Anemia Panel: No results for input(s): VITAMINB12, FOLATE, FERRITIN, TIBC, IRON, RETICCTPCT in the last 72 hours. Sepsis Labs: No results for input(s): PROCALCITON, LATICACIDVEN in the last 168 hours.  Recent Results (from the past 240 hour(s))  Urine Culture     Status: None   Collection Time: 03/30/17  4:58 PM  Result Value Ref Range Status   Specimen Description URINE, RANDOM  Final   Special Requests NONE  Final   Culture NO GROWTH  Final   Report Status 03/31/2017 FINAL  Final  MRSA PCR Screening     Status: Abnormal   Collection Time: 03/30/17  5:06 PM  Result Value Ref Range Status   MRSA by PCR POSITIVE (A) NEGATIVE Final    Comment:        The GeneXpert MRSA Assay (FDA approved for NASAL specimens only), is one component of a comprehensive MRSA colonization surveillance program. It is not intended to diagnose MRSA infection nor to guide or monitor treatment for MRSA infections. RESULT CALLED TO, READ BACK BY AND VERIFIED WITH: A MINTZ,RN AT 1930 03/30/17 BY L BENFIELD  Radiology Studies: Ct Head Wo Contrast  Result Date: 03/31/2017 CLINICAL DATA:  Altered mental status. History of bipolar disorder, dementia. EXAM: CT HEAD WITHOUT CONTRAST TECHNIQUE: Contiguous axial images were obtained from the base of the skull through the vertex without intravenous contrast. COMPARISON:  None. FINDINGS: BRAIN: No intraparenchymal hemorrhage, mass effect nor midline shift. The ventricles and sulci are normal for age. Patchy supratentorial white matter hypodensities less than expected for patient's age, though non-specific are most compatible with chronic small vessel ischemic disease. No acute large vascular territory infarcts. No abnormal extra-axial fluid collections. Basal cisterns are patent. VASCULAR: Moderate calcific atherosclerosis of the carotid siphons and to lesser extent vertebral artery's. SKULL: No skull fracture. No significant scalp soft tissue  swelling. SINUSES/ORBITS: The mastoid air-cells and included paranasal sinuses are well-aerated.The included ocular globes and orbital contents are non-suspicious. Status post bilateral ocular lens implants. OTHER: None. IMPRESSION: Negative noncontrast CT HEAD for age. Electronically Signed   By: Elon Alas M.D.   On: 03/31/2017 13:52   Dg Chest Port 1 View  Result Date: 03/31/2017 CLINICAL DATA:  Cough and confusion EXAM: PORTABLE CHEST 1 VIEW COMPARISON:  None. FINDINGS: There is atelectatic change in the left base. There is no edema or consolidation. The heart is mildly enlarged with pulmonary vascularity within normal limits. There is aortic atherosclerosis. No evident adenopathy. There is evidence of old trauma involving the left proximal humerus with erosion of the proximal humeral shaft on the left. IMPRESSION: Atelectatic change left base. No edema or consolidation. Heart mildly enlarged with pulmonary vascularity within normal limits. There is aortic atherosclerosis. There is an old fracture of the proximal left humerus with chronic appearing erosion proximal left humerus. Aortic Atherosclerosis (ICD10-I70.0). Electronically Signed   By: Lowella Grip III M.D.   On: 03/31/2017 09:22        Scheduled Meds: . Chlorhexidine Gluconate Cloth  6 each Topical Q0600  . lithium carbonate  450 mg Oral QHS  . mupirocin ointment  1 application Nasal BID  . sodium chloride flush  3 mL Intravenous Q12H   Continuous Infusions: . cefTRIAXone (ROCEPHIN)  IV Stopped (03/31/17 1017)  . dextrose 100 mL/hr at 03/31/17 0947     LOS: 1 day    Time spent: 40 minutes    Irine Seal, MD [facility] Pager [phone]  If 7PM-7AM, please contact night-coverage [email or web address] Password [facility] 03/31/2017, 6:22 PM

## 2017-03-31 NOTE — Evaluation (Signed)
Occupational Therapy Evaluation Patient Details Name: Paul Costa MRN: [id number] DOB: 1925/06/21 Today's Date: 03/31/2017    History of Present Illness 83 y.o.malewith medical history significant ofdiverticulosis with hemorrhage, chronic blood loss anemia, mild dementia, urinary retention chronic indwelling Foley, hypertension, hyperlipidemia, gastroesophageal reflux, essential tremor, depression, prior stroke, bipolar disorder on lithium who presents from nursing home for acute onset confusion.   Clinical Impression   This 83 yo male admitted with above presents to acute OT with deficits below (see OT problem list) thus affecting his ability to A with basic ADLs. He will benefit from acute OT with follow up OT at SNF as they deem appropriate.      Follow Up Recommendations  SNF;Supervision/Assistance - 24 hour    Equipment Recommendations  None recommended by OT       Precautions / Restrictions Precautions Precautions: Fall Restrictions Weight Bearing Restrictions: No      Mobility Bed Mobility Overal bed mobility: Needs Assistance Bed Mobility: Rolling;Supine to Sit;Sit to Supine Rolling: Min assist   Supine to sit: Mod assist Sit to supine: Min assist   General bed mobility comments: moderate assist to elevate trunk to upright and rotate hips to EOB, min assist to reposition upon return to supine  Transfers Overall transfer level: Needs assistance Equipment used: 2 person hand held assist Transfers: Sit to/from Stand Sit to Stand: +2 physical assistance;Mod assist         General transfer comment: Moderate assist of two to stand from bed    Balance Overall balance assessment: Needs assistance   Sitting balance-Leahy Scale: Fair Sitting balance - Comments: able to sit EOB with min guard     Standing balance-Leahy Scale: Poor Standing balance comment: required bilateral UE support and assist to maintain static standing                           ADL either performed or assessed with clinical judgement   ADL Overall ADL's : Needs assistance/impaired                                       General ADL Comments: pt currently total A due to confusion     Vision   Additional Comments: No visual issues noted            Pertinent Vitals/Pain Pain Assessment: Faces Faces Pain Scale: Hurts little more Pain Location: left shoulder Pain Descriptors / Indicators: Sore Pain Intervention(s): Monitored during session     Hand Dominance  right   Extremity/Trunk Assessment Upper Extremity Assessment Upper Extremity Assessment: Generalized weakness;RUE deficits/detail;LUE deficits/detail;Difficult to assess due to impaired cognition RUE Coordination: decreased fine motor;decreased gross motor(noted resting tremor) LUE Coordination: decreased fine motor;decreased gross motor(noted resting tremor)   Lower Extremity Assessment Lower Extremity Assessment: Generalized weakness;RLE deficits/detail;LLE deficits/detail;Difficult to assess due to impaired cognition RLE Coordination: decreased fine motor;decreased gross motor LLE Deficits / Details: noted functional weakness/ buckling assymertrical compared to right LLE Coordination: decreased fine motor;decreased gross motor   Cervical / Trunk Assessment Cervical / Trunk Assessment: Kyphotic   Communication Communication Communication: Expressive difficulties   Cognition Arousal/Alertness: Awake/alert Behavior During Therapy: Restless Overall Cognitive Status: History of cognitive impairments - at baseline  General Comments: patient is alert and oriented to self, and [place] but not to situration, time or specific place. Patient follows commands consistently throughout session but does require some increased time              Home Living Family/patient expects to be discharged to:: Skilled nursing facility                                         Prior Functioning/Environment Level of Independence: Needs assistance        Comments: patient a resident at SNF        OT Problem List: Impaired balance (sitting and/or standing);Decreased cognition;Pain      OT Treatment/Interventions: Self-care/ADL training;Balance training;Therapeutic activities;DME and/or AE instruction;Patient/family education    OT Goals(Current goals can be found in the care plan section) Acute Rehab OT Goals Patient Stated Goal: pt did not state OT Goal Formulation: With patient Time For Goal Achievement: 04/14/17 Potential to Achieve Goals: Fair  OT Frequency: Min 2X/week   Barriers to D/C: Decreased caregiver support          Co-evaluation PT/OT/SLP Co-Evaluation/Treatment: Yes Reason for Co-Treatment: Complexity of the patient's impairments (multi-system involvement);Necessary to address cognition/behavior during functional activity;For patient/therapist safety PT goals addressed during session: Mobility/safety with mobility;Balance OT goals addressed during session: ADL's and self-care;Strengthening/ROM      AM-PAC PT "6 Clicks" Daily Activity     Outcome Measure Help from another person eating meals?: Total Help from another person taking care of personal grooming?: Total Help from another person toileting, which includes using toliet, bedpan, or urinal?: Total Help from another person bathing (including washing, rinsing, drying)?: Total Help from another person to put on and taking off regular upper body clothing?: Total Help from another person to put on and taking off regular lower body clothing?: Total 6 Click Score: 6   End of Session Equipment Utilized During Treatment: Gait belt  Activity Tolerance: Patient tolerated treatment well Patient left: in bed;with bed alarm set  OT Visit Diagnosis: Unsteadiness on feet (R26.81);Other abnormalities of gait and mobility (R26.89);Cognitive  communication deficit (R41.841);Pain Pain - Right/Left: Left Pain - part of body: Shoulder                Time: 2778-2423 OT Time Calculation (min): 17 min Charges:  OT General Charges $OT Visit: 1 Visit OT Evaluation $OT Eval Moderate Complexity: [address] [place] [phone] 03/31/2017

## 2017-04-01 ENCOUNTER — Encounter (HOSPITAL_COMMUNITY): Payer: Self-pay | Admitting: Gastroenterology

## 2017-04-01 LAB — CBC
HEMATOCRIT: 30.6 % — AB (ref 39.0–52.0)
Hemoglobin: 8.9 g/dL — ABNORMAL LOW (ref 13.0–17.0)
MCH: 24.9 pg — AB (ref 26.0–34.0)
MCHC: 29.1 g/dL — AB (ref 30.0–36.0)
MCV: 85.7 fL (ref 78.0–100.0)
PLATELETS: 401 10*3/uL — AB (ref 150–400)
RBC: 3.57 MIL/uL — ABNORMAL LOW (ref 4.22–5.81)
RDW: 15.4 % (ref 11.5–15.5)
WBC: 14.3 10*3/uL — ABNORMAL HIGH (ref 4.0–10.5)

## 2017-04-01 LAB — BASIC METABOLIC PANEL
Anion gap: 8 (ref 5–15)
BUN: 29 mg/dL — AB (ref 6–20)
CALCIUM: 8.9 mg/dL (ref 8.9–10.3)
CO2: 21 mmol/L — AB (ref 22–32)
CREATININE: 1.38 mg/dL — AB (ref 0.61–1.24)
Chloride: 117 mmol/L — ABNORMAL HIGH (ref 101–111)
GFR calc Af Amer: 51 mL/min — ABNORMAL LOW (ref >60)
GFR calc non Af Amer: 44 mL/min — ABNORMAL LOW (ref >60)
GLUCOSE: 143 mg/dL — AB (ref 65–99)
Potassium: 3.9 mmol/L (ref 3.5–5.1)
Sodium: 146 mmol/L — ABNORMAL HIGH (ref 135–145)

## 2017-04-01 LAB — MAGNESIUM: Magnesium: 2.3 mg/dL (ref 1.7–2.4)

## 2017-04-01 MED ORDER — SODIUM CHLORIDE 0.45 % IV BOLUS
500.0000 mL | Freq: Once | INTRAVENOUS | Status: AC
  Administered 2017-04-01: 500 mL via INTRAVENOUS

## 2017-04-01 NOTE — Progress Notes (Signed)
PROGRESS NOTE    Paul Costa  DZH:[id number] DOB: 08-16-1925 DOA: 03/30/2017 PCP: Patient, No Pcp Per    Brief Narrative:  Patient is a 83 year old gentleman history of diverticulosis with hemorrhage, chronic blood loss, mild dementia, urinary retention with chronic indwelling Foley catheter, hypertension, GERD, essential tremors, bipolar disorder on lithium presents from [facility] due to worsening confusion with some associated shaking and twitching.  Patient noted to have a hemoglobin of 7.2 and a creatinine of 2.2.  Urine cultures pending.  FOBT was positive.  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  Biopsies pending.   Assessment & Plan:   Principal Problem:   Acute on chronic blood loss anemia (HCC) Active Problems:   Acute GI bleeding   CKD (chronic kidney disease), stage III (HCC)   Urinary retention   Abnormal urinalysis   Mild dementia   Acute metabolic encephalopathy   Hematemesis   Esophageal mass   Leukocytosis  #1 acute on chronic blood loss anemia Likely secondary to esophageal mass noted on upper endoscopy and duodenal erosions.  Patient status post 1 unit packed red blood cells.  Hemoglobin currently at 8.9 from 9.1 from 6.9.  Patient status post upper endoscopy with esophageal mass which was partially obstructing noted.  Transfusion threshold hemoglobin less than 7.  Follow H&H.  2.  Partially obstructing esophageal tumor Per upper endoscopy 03/31/2017.  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  Biopsies taken.  Patient with advanced age, poor condition.  Per GI likely conservative approach is warranted at this time.  Continue current pured diet.  Palliative care consultation pending.    3.  Acute metabolic encephalopathy Questionable etiology.  May be secondary to problems number 1, 2.  CT head unremarkable.  MRI head done 03/31/2017 of poor quality due to  motion.  Urine cultures negative to date.  Chest x-ray done negative for any acute infiltrate.  Patient empirically on IV Rocephin.  Patient currently afebrile.  As no source of infection noted will discontinue IV antibiotics.  Ativan as needed.  4.  Mild dementia Ativan as needed.  Will also add Haldol as needed for severe agitation.  5.  Chronic kidney disease stage III Stable.  6.  Urinary retention Continue chronic indwelling Foley catheter.  Outpatient follow-up with urology on discharge.  7.  Mild dementia Outpatient follow-up.  Patient's mentation has worsened.  Infectious workup underway and negative to date.  Patient has been on IV Rocephin since admission and as such we will discontinue for now.  Follow.  8.  Leukocytosis Likely reactive leukocytosis.  Urine cultures negative with no growth to date.  Chest x-ray negative.  Discontinue IV antibiotics and follow.  9.  Hypernatremia Likely secondary to dehydration/volume depletion.  Continue D5W.  We will give a bolus of normal saline.  Follow.     DVT prophylaxis: SCDs Code Status: DNR Family Communication:No family at bedside Disposition Plan: To be determined.   Consultants:   Gastroenterology: Dr Loletha Carrow III 03/31/2017  Procedures:   CT head 03/31/2017  Upper endoscopy 03/31/2017 per Dr.Danis III  Chest x-ray 03/31/2017  Transfused 1 unit packed red blood cells 03/30/2017  MRI head 03/31/2017  Antimicrobials:   IV Rocephin 03/30/2017>>>>> 04/01/2017   Subjective: Patient sleeping however easily arousable.  Patient less confused.  Less agitated.  [place].  Objective: Vitals:   03/31/17 1500 03/31/17 2257 04/01/17 0439 04/01/17 0947  BP: (!) 170/96 (!) 166/64 (!) 158/93 Marland Kitchen)  164/89  Pulse: 74 (!) 57 78 77  Resp: 18 16 18 17   Temp: 98.7 F (37.1 C) 97.6 F (36.4 C) 97.6 F (36.4 C) 97.6 F (36.4 C)  TempSrc: Oral Oral Oral Oral  SpO2: 98% 100% 92% 100%  Weight:      Height:         Intake/Output Summary (Last 24 hours) at 04/01/2017 1225 Last data filed at 04/01/2017 1000 Gross per 24 hour  Intake 2181.67 ml  Output 1500 ml  Net 681.67 ml   Filed Weights   03/30/17 2014 03/31/17 1135  Weight: 70.3 kg (155 lb) 70.3 kg (155 lb)    Examination:  General exam: Sleeping.  Easily arousable.  Less agitated.  Dry mucous membranes. Respiratory system: Clear to auscultation anterior lung fields.  No wheezes, no crackles, no rhonchi.  Respiratory effort normal. Cardiovascular system: S1 & S2 heard, RRR. No JVD, murmurs, rubs, gallops or clicks. No pedal edema. Gastrointestinal system: Abdomen is soft, nontender, nondistended, positive bowel sounds.  No rebound.  No guarding.  Central nervous system: Alert and oriented. No focal neurological deficits. Extremities: Symmetric 5 x 5 power. Skin: No rashes, lesions or ulcers Psychiatry: Judgement and insight appear poor. Mood & affect appropriate.     Data Reviewed: I have personally reviewed following labs and imaging studies  CBC: Recent Labs  Lab 03/30/17 0704 03/30/17 1830 03/31/17 0558 04/01/17 0748  WBC 12.9*  --  13.8* 14.3*  NEUTROABS 11.1*  --   --   --   HGB 6.9* 8.6* 9.1* 8.9*  HCT 23.1* 28.3* 30.7* 30.6*  MCV 84.3  --  85.3 85.7  PLT 405*  --  421* 423*   Basic Metabolic Panel: Recent Labs  Lab 03/30/17 0704 03/31/17 0558 04/01/17 0748  NA 140 148* 146*  K 4.3 4.1 3.9  CL 114* 118* 117*  CO2 20* 21* 21*  GLUCOSE 111* 109* 143*  BUN 38* 35* 29*  CREATININE 1.83* 1.62* 1.38*  CALCIUM 8.8* 9.2 8.9  MG  --   --  2.3   GFR: Estimated Creatinine Clearance: 36.1 mL/min (A) (by C-G formula based on SCr of 1.38 mg/dL (H)). Liver Function Tests: Recent Labs  Lab 03/30/17 0704  AST 27  ALT 16*  ALKPHOS 148*  BILITOT 0.6  PROT 5.9*  ALBUMIN 2.3*   No results for input(s): LIPASE, AMYLASE in the last 168 hours. No results for input(s): AMMONIA in the last 168 hours. Coagulation  Profile: No results for input(s): INR, PROTIME in the last 168 hours. Cardiac Enzymes: No results for input(s): CKTOTAL, CKMB, CKMBINDEX, TROPONINI in the last 168 hours. BNP (last 3 results) No results for input(s): PROBNP in the last 8760 hours. HbA1C: No results for input(s): HGBA1C in the last 72 hours. CBG: No results for input(s): GLUCAP in the last 168 hours. Lipid Profile: No results for input(s): CHOL, HDL, LDLCALC, TRIG, CHOLHDL, LDLDIRECT in the last 72 hours. Thyroid Function Tests: No results for input(s): TSH, T4TOTAL, FREET4, T3FREE, THYROIDAB in the last 72 hours. Anemia Panel: No results for input(s): VITAMINB12, FOLATE, FERRITIN, TIBC, IRON, RETICCTPCT in the last 72 hours. Sepsis Labs: No results for input(s): PROCALCITON, LATICACIDVEN in the last 168 hours.  Recent Results (from the past 240 hour(s))  Urine Culture     Status: None   Collection Time: 03/30/17  4:58 PM  Result Value Ref Range Status   Specimen Description URINE, RANDOM  Final   Special Requests NONE  Final  Culture NO GROWTH  Final   Report Status 03/31/2017 FINAL  Final  MRSA PCR Screening     Status: Abnormal   Collection Time: 03/30/17  5:06 PM  Result Value Ref Range Status   MRSA by PCR POSITIVE (A) NEGATIVE Final    Comment:        The GeneXpert MRSA Assay (FDA approved for NASAL specimens only), is one component of a comprehensive MRSA colonization surveillance program. It is not intended to diagnose MRSA infection nor to guide or monitor treatment for MRSA infections. RESULT CALLED TO, READ BACK BY AND VERIFIED WITH: A MINTZ,RN AT 1930 03/30/17 BY L BENFIELD          Radiology Studies: Ct Head Wo Contrast  Result Date: 03/31/2017 CLINICAL DATA:  Altered mental status. History of bipolar disorder, dementia. EXAM: CT HEAD WITHOUT CONTRAST TECHNIQUE: Contiguous axial images were obtained from the base of the skull through the vertex without intravenous contrast.  COMPARISON:  None. FINDINGS: BRAIN: No intraparenchymal hemorrhage, mass effect nor midline shift. The ventricles and sulci are normal for age. Patchy supratentorial white matter hypodensities less than expected for patient's age, though non-specific are most compatible with chronic small vessel ischemic disease. No acute large vascular territory infarcts. No abnormal extra-axial fluid collections. Basal cisterns are patent. VASCULAR: Moderate calcific atherosclerosis of the carotid siphons and to lesser extent vertebral artery's. SKULL: No skull fracture. No significant scalp soft tissue swelling. SINUSES/ORBITS: The mastoid air-cells and included paranasal sinuses are well-aerated.The included ocular globes and orbital contents are non-suspicious. Status post bilateral ocular lens implants. OTHER: None. IMPRESSION: Negative noncontrast CT HEAD for age. Electronically Signed   By: Elon Alas M.D.   On: 03/31/2017 13:52   Mr Jeri Cos DX Contrast  Result Date: 03/31/2017 CLINICAL DATA:  Altered mental status.  History of esophageal tumor. EXAM: MRI HEAD WITHOUT AND WITH CONTRAST TECHNIQUE: Multiplanar, multiecho pulse sequences of the brain and surrounding structures were obtained without and with intravenous contrast. CONTRAST:  20mL MULTIHANCE GADOBENATE DIMEGLUMINE 529 MG/ML IV SOLN COMPARISON:  Head CT 03/31/2017 FINDINGS: The study is degraded by motion, despite efforts to reduce this artifact, including utilization of motion-resistant MR sequences. The findings of the study are interpreted in the context of reduced sensitivity/specificity. The coronal T1-weighted postcontrast sequence was omitted. Brain: The midline structures are normal. There is no acute infarct or acute hemorrhage. No mass lesion, hydrocephalus, dural abnormality or extra-axial collection. Periventricular white matter hyperintensities suggestive of chronic small vessel disease. No age-advanced or lobar predominant atrophy. No  chronic microhemorrhage or superficial siderosis. The postcontrast T1-weighted imaging is particularly degraded by motion. At the lateral aspect of the right orbital apex is a focus of contrast enhancement that measures 10 x 8 mm. No other abnormal intracranial contrast enhancement is identified. Vascular: Major intracranial arterial and venous sinus flow voids are preserved. Skull and upper cervical spine: The visualized skull base, calvarium, upper cervical spine and extracranial soft tissues are normal. Sinuses/Orbits: No fluid levels or advanced mucosal thickening. No mastoid or middle ear effusion. Normal orbits. IMPRESSION: 1. Markedly motion degraded examination, particularly on the postcontrast sequences. 2. Focus of contrast enhancement at the lateral aspect of the right orbital apex is indeterminate and seen only on the motion degraded post-contrast sequence. The most likely explanation is probably artifact; however, in a patient with a known primary neoplasm, repeating the study when the patient can be adequately sedated or otherwise more cooperative is recommended to exclude metastatic disease. 3. Mild chronic  microvascular disease without acute intracranial abnormality. Electronically Signed   By: Ulyses Jarred M.D.   On: 03/31/2017 23:34   Dg Chest Port 1 View  Result Date: 03/31/2017 CLINICAL DATA:  Cough and confusion EXAM: PORTABLE CHEST 1 VIEW COMPARISON:  None. FINDINGS: There is atelectatic change in the left base. There is no edema or consolidation. The heart is mildly enlarged with pulmonary vascularity within normal limits. There is aortic atherosclerosis. No evident adenopathy. There is evidence of old trauma involving the left proximal humerus with erosion of the proximal humeral shaft on the left. IMPRESSION: Atelectatic change left base. No edema or consolidation. Heart mildly enlarged with pulmonary vascularity within normal limits. There is aortic atherosclerosis. There is an old  fracture of the proximal left humerus with chronic appearing erosion proximal left humerus. Aortic Atherosclerosis (ICD10-I70.0). Electronically Signed   By: Lowella Grip III M.D.   On: 03/31/2017 09:22        Scheduled Meds: . Chlorhexidine Gluconate Cloth  6 each Topical Q0600  . lithium carbonate  450 mg Oral QHS  . mupirocin ointment  1 application Nasal BID  . sodium chloride flush  3 mL Intravenous Q12H   Continuous Infusions: . cefTRIAXone (ROCEPHIN)  IV Stopped (03/31/17 1017)  . dextrose 100 mL/hr at 04/01/17 0324     LOS: 2 days    Time spent: 35 minutes    Irine Seal, MD [facility] Pager [phone]  If 7PM-7AM, please contact night-coverage [email or web address] Password [facility] 04/01/2017, 12:25 PM

## 2017-04-02 DIAGNOSIS — Z515 Encounter for palliative care: Secondary | ICD-10-CM

## 2017-04-02 DIAGNOSIS — R627 Adult failure to thrive: Secondary | ICD-10-CM

## 2017-04-02 DIAGNOSIS — Z66 Do not resuscitate: Secondary | ICD-10-CM

## 2017-04-02 LAB — CBC WITH DIFFERENTIAL/PLATELET
Basophils Absolute: 0 10*3/uL (ref 0.0–0.1)
Basophils Relative: 0 %
EOS ABS: 0.4 10*3/uL (ref 0.0–0.7)
EOS PCT: 3 %
HCT: 27.8 % — ABNORMAL LOW (ref 39.0–52.0)
HEMOGLOBIN: 8.4 g/dL — AB (ref 13.0–17.0)
LYMPHS ABS: 1 10*3/uL (ref 0.7–4.0)
Lymphocytes Relative: 8 %
MCH: 25.4 pg — AB (ref 26.0–34.0)
MCHC: 30.2 g/dL (ref 30.0–36.0)
MCV: 84 fL (ref 78.0–100.0)
MONO ABS: 0.6 10*3/uL (ref 0.1–1.0)
MONOS PCT: 5 %
NEUTROS PCT: 84 %
Neutro Abs: 10 10*3/uL — ABNORMAL HIGH (ref 1.7–7.7)
Platelets: 329 10*3/uL (ref 150–400)
RBC: 3.31 MIL/uL — ABNORMAL LOW (ref 4.22–5.81)
RDW: 15.7 % — AB (ref 11.5–15.5)
WBC: 11.9 10*3/uL — ABNORMAL HIGH (ref 4.0–10.5)

## 2017-04-02 LAB — BASIC METABOLIC PANEL
Anion gap: 8 (ref 5–15)
BUN: 22 mg/dL — AB (ref 6–20)
CHLORIDE: 112 mmol/L — AB (ref 101–111)
CO2: 22 mmol/L (ref 22–32)
CREATININE: 1.18 mg/dL (ref 0.61–1.24)
Calcium: 8.5 mg/dL — ABNORMAL LOW (ref 8.9–10.3)
GFR calc Af Amer: >60 mL/min (ref >60)
GFR calc non Af Amer: 53 mL/min — ABNORMAL LOW (ref >60)
Glucose, Bld: 133 mg/dL — ABNORMAL HIGH (ref 65–99)
Potassium: 3.6 mmol/L (ref 3.5–5.1)
SODIUM: 142 mmol/L (ref 135–145)

## 2017-04-02 MED ORDER — POLYETHYLENE GLYCOL 3350 17 G PO PACK
17.0000 g | PACK | Freq: Every day | ORAL | Status: DC
  Administered 2017-04-02 – 2017-04-03 (×2): 17 g via ORAL
  Filled 2017-04-02 (×2): qty 1

## 2017-04-02 MED ORDER — ALBUTEROL SULFATE (2.5 MG/3ML) 0.083% IN NEBU: 3.0000 mL | INHALATION_SOLUTION | RESPIRATORY_TRACT | Status: DC | PRN

## 2017-04-02 MED ORDER — DICLOFENAC SODIUM 1 % TD GEL: 2.0000 g | Freq: Three times a day (TID) | TRANSDERMAL | Status: DC | PRN

## 2017-04-02 MED ORDER — DUTASTERIDE 0.5 MG PO CAPS
0.5000 mg | ORAL_CAPSULE | Freq: Every day | ORAL | Status: DC
  Administered 2017-04-02 – 2017-04-03 (×2): 0.5 mg via ORAL
  Filled 2017-04-02 (×3): qty 1

## 2017-04-02 MED ORDER — ALUM & MAG HYDROXIDE-SIMETH 200-200-20 MG/5ML PO SUSP: 30.0000 mL | Freq: Four times a day (QID) | ORAL | Status: DC | PRN

## 2017-04-02 MED ORDER — FLUTICASONE FUROATE-VILANTEROL 100-25 MCG/INH IN AEPB
1.0000 | INHALATION_SPRAY | Freq: Every day | RESPIRATORY_TRACT | Status: DC
  Administered 2017-04-03: 1 via RESPIRATORY_TRACT
  Filled 2017-04-02: qty 28

## 2017-04-02 MED ORDER — FLUTICASONE PROPIONATE 50 MCG/ACT NA SUSP
1.0000 | Freq: Every day | NASAL | Status: DC
  Administered 2017-04-02 – 2017-04-03 (×2): 1 via NASAL
  Filled 2017-04-02: qty 16

## 2017-04-02 MED ORDER — TAMSULOSIN HCL 0.4 MG PO CAPS
0.4000 mg | ORAL_CAPSULE | Freq: Two times a day (BID) | ORAL | Status: DC
  Administered 2017-04-02 – 2017-04-03 (×4): 0.4 mg via ORAL
  Filled 2017-04-02 (×4): qty 1

## 2017-04-02 MED ORDER — RESOURCE THICKENUP CLEAR PO POWD
ORAL | Status: DC | PRN
  Filled 2017-04-02: qty 125

## 2017-04-02 MED ORDER — ALBUTEROL SULFATE (2.5 MG/3ML) 0.083% IN NEBU
2.5000 mg | INHALATION_SOLUTION | Freq: Three times a day (TID) | RESPIRATORY_TRACT | Status: DC
  Administered 2017-04-02 – 2017-04-03 (×2): 2.5 mg via RESPIRATORY_TRACT
  Filled 2017-04-02 (×2): qty 3

## 2017-04-02 MED ORDER — ALBUTEROL SULFATE (2.5 MG/3ML) 0.083% IN NEBU
2.5000 mg | INHALATION_SOLUTION | Freq: Four times a day (QID) | RESPIRATORY_TRACT | Status: DC
  Administered 2017-04-02: 2.5 mg via RESPIRATORY_TRACT
  Filled 2017-04-02: qty 3

## 2017-04-02 MED ORDER — CARVEDILOL 6.25 MG PO TABS
6.2500 mg | ORAL_TABLET | Freq: Two times a day (BID) | ORAL | Status: DC
  Administered 2017-04-02 – 2017-04-03 (×3): 6.25 mg via ORAL
  Filled 2017-04-02 (×3): qty 1

## 2017-04-02 MED ORDER — BETHANECHOL CHLORIDE 25 MG PO TABS
50.0000 mg | ORAL_TABLET | Freq: Two times a day (BID) | ORAL | Status: DC
  Administered 2017-04-02 – 2017-04-03 (×4): 50 mg via ORAL
  Filled 2017-04-02 (×5): qty 2

## 2017-04-02 MED ORDER — LITHIUM CARBONATE ER 450 MG PO TBCR: 450.0000 mg | EXTENDED_RELEASE_TABLET | Freq: Every day | ORAL | Status: DC

## 2017-04-02 MED ORDER — MAGNESIUM HYDROXIDE 400 MG/5ML PO SUSP: 30.0000 mL | Freq: Every day | ORAL | Status: DC | PRN

## 2017-04-02 MED ORDER — OXYCODONE HCL 5 MG PO TABS: 2.5000 mg | ORAL_TABLET | Freq: Four times a day (QID) | ORAL | Status: DC | PRN

## 2017-04-02 MED ORDER — SODIUM CHLORIDE 0.45 % IV SOLN
INTRAVENOUS | Status: DC
  Administered 2017-04-02 – 2017-04-03 (×2): via INTRAVENOUS

## 2017-04-02 MED ORDER — SODIUM CHLORIDE 0.45 % IV SOLN
INTRAVENOUS | Status: DC
  Filled 2017-04-02: qty 1000

## 2017-04-02 NOTE — Consult Note (Signed)
Consultation Note Date: 04/02/2017   Patient Name: Paul Costa  DOB: October 13, 1925  MRN: [id number]  Age / Sex: 83 y.o., male  PCP: Patient, No Pcp Per Referring Physician: Eugenie Filler, MD  Reason for Consultation: Establishing goals of care and Psychosocial/spiritual support  HPI/Patient Profile: 83 y.o. male  admitted on 03/30/2017 with past medical history significant of diverticulosis with hemorrhage, chronic blood loss anemia, mild dementia, urinary retention chronic indwelling foley, hypertension, hyperlipidemia, gastroesophageal reflux, essential tremor, depression, prior stroke, bipolar disorder on lithium who resents to our facility in transfer from [facility].    Per EMR patient presented with confusion, shaking and twitching.   Evaluation in the emergency department revealed a markedly low hemoglobin of 7.2.   He had a serum creatinine of 2.2 showing a acute on chronic kidney disease.    Workup by GI significant for esophageal mass, likely malignant, biopsy pending.  Family reports continued physical and functional decline over the past two years.  Family face advanced directive decisions, viable treatment options decisions and anticipatory care needs.     Clinical Assessment and Goals of Care:  This NP Paul Costa reviewed medical records, received report from team, assessed the patient and then meet at the patient's bedside along with his son/HPOA Paul Costa to discuss diagnosis, prognosis, [place], EOL wishes disposition and options.  A detailed discussion was had today regarding advanced directives.  Concepts specific to code status, artifical feeding and hydration, continued IV antibiotics and rehospitalization was had.  The difference between an aggressive medical intervention path  and a palliative comfort care path for this patient at this time was had.  Values and goals of  care important to patient and family were attempted to be elicited.  MOST form introduced  Concept of Hospice and Palliative Care were discussed  Natural trajectory and expectations at EOL were discussed.  Questions and concerns addressed.   Family encouraged to call with questions or concerns.  PMT will continue to support holistically.   HCPOA/ son Paul Costa- only living relative    [place]:  DNR   Palliative Prophylaxis:   Aspiration, delirium, pain assessment and safety  Additional Recommendations (Limitations, Scope, Preferences):   Son is leaning toward a comfort approach.  His hope is for comfort and dignity for his father.  Will process today's conversation, await biopsy results and make decision in the morning at 1000 am  Psycho-social/Spiritual:   Desire for further Chaplaincy support:no, decline  *    Additional Recommendations: Education on hospice benefit  Prognosis:   Weeks to months-dependant on decisions for life prolonging measures  Discharge Planning: to be determined      Primary Diagnoses: Present on Admission: . Acute GI bleeding . Acute on chronic blood loss anemia (HCC) . CKD (chronic kidney disease), stage III ([place]) . Urinary retention . Abnormal urinalysis . Mild dementia . Hematemesis . Esophageal mass   I have reviewed the medical record, interviewed the patient and family, and  examined the patient. The following aspects are pertinent.  Past Medical History:  Diagnosis Date  . Anxiety   . Bipolar disorder ([place])    On lithium in the past  . Blood loss anemia   . CVA (cerebral vascular accident) ([place])   . Depression   . Diverticulosis   . Diverticulosis of large intestine with hemorrhage   . Essential tremor   . Gastroesophageal reflux   . Hyperlipidemia   . Hypertension   . Metabolic encephalopathy   . Mild cognitive impairment   . Osteoarthritis   . Pneumonia    . Prostatic hypertrophy   . Skin cancer   . Valvular heart disease    Social History   Socioeconomic History  . Marital status: Married    Spouse name: None  . Number of children: None  . Years of education: None  . Highest education level: None  Social Needs  . Financial resource strain: None  . Food insecurity - worry: None  . Food insecurity - inability: None  . Transportation needs - medical: None  . Transportation needs - non-medical: None  Occupational History  . None  Tobacco Use  . Smoking status: Smoker, Current Status Unknown  . Smokeless tobacco: Never Used  Substance and Sexual Activity  . Alcohol use: No    Frequency: Never  . Drug use: No  . Sexual activity: Not Currently  Other Topics Concern  . None  Social History Narrative   Lives in skilled nursing facility in [place]   Family History  Problem Relation Age of Onset  . Leukemia Father    Scheduled Meds: . Chlorhexidine Gluconate Cloth  6 each Topical Q0600  . lithium carbonate  450 mg Oral QHS  . mupirocin ointment  1 application Nasal BID  . sodium chloride flush  3 mL Intravenous Q12H   Continuous Infusions: . dextrose 100 mL/hr at 04/02/17 0616   PRN Meds:.acetaminophen **OR** acetaminophen, haloperidol lactate, LORazepam, morphine injection Medications Prior to Admission:  Prior to Admission medications   Medication Sig Start Date End Date Taking? Authorizing Provider  acetaminophen (TYLENOL) 500 MG tablet Take 1,000 mg by mouth 3 (three) times daily.   Yes [provider]  albuterol (PROVENTIL HFA;VENTOLIN HFA) 108 (90 Base) MCG/ACT inhaler Inhale 2 puffs into the lungs every 4 (four) hours as needed for shortness of breath.   Yes [provider]  albuterol (PROVENTIL) (2.5 MG/3ML) 0.083% nebulizer solution Take 2.5 mg by nebulization every 6 (six) hours.   Yes [provider]  alum & mag hydroxide-simeth (MAALOX/MYLANTA) 200-200-20 MG/5ML suspension Take 30 mLs  by mouth every 6 (six) hours as needed for indigestion or heartburn.   Yes [provider]  amoxicillin (AMOXIL) 500 MG capsule Take 2,000 mg by mouth See admin instructions. Take 1 hour prior to dental procedure   Yes [provider]  aspirin 325 MG tablet Take 325 mg by mouth See admin instructions. As directed by EMS for chest pain after EMS has been dispatched [place]   Yes [provider]  bethanechol (URECHOLINE) 50 MG tablet Take 50 mg by mouth 2 (two) times daily. 03/19/17  Yes [provider]  BREO ELLIPTA 100-25 MCG/INH AEPB Take 1 puff by mouth daily. 03/26/17  Yes [provider]  Calcium Carb-Cholecalciferol (CALCIUM/VITAMIN D) 600-400 MG-UNIT TABS Take 2 tablets by mouth daily.   Yes [provider]  carvedilol (COREG) 6.25 MG tablet Take 6.25 mg by mouth 2 (two) times daily.  03/26/17  Yes [provider]  diclofenac sodium (VOLTAREN) 1 % GEL Apply 2 g topically 3 (three) times daily as needed (right and left knee pain).   Yes [provider]  dutasteride (AVODART) 0.5 MG capsule Take 0.5 mg by mouth at bedtime.   Yes [provider]  ferrous sulfate 325 (65 FE) MG tablet Take 325 mg by mouth daily with breakfast.   Yes [provider]  fluticasone (FLONASE) 50 MCG/ACT nasal spray Place 1 spray into both nostrils daily. 03/26/17  Yes [provider]  gemfibrozil (LOPID) 600 MG tablet Take 600 mg by mouth every morning. 03/07/17  Yes [provider]  guaifenesin (ROBITUSSIN) 100 MG/5ML syrup Take 200 mg by mouth every 6 (six) hours as needed for cough (for 3 days. Notify physician if more than 3 days).   Yes [provider]  Hydrocortisone-Aloe 0.5 % CREA Apply 1 application topically every 6 (six) hours as needed (itch/rash).   Yes [provider]  lithium carbonate (ESKALITH) 450 MG CR tablet Take 450 mg by mouth at bedtime.   Yes [provider]    magnesium hydroxide (MILK OF MAGNESIA) 400 MG/5ML suspension Take 30 mLs by mouth daily as needed for mild constipation.   Yes [provider]  Melatonin 3 MG TABS Take 6 mg by mouth at bedtime.   Yes [provider]  Menthol-Zinc Oxide (RISAMINE) 0.44-20.625 % OINT Apply 1 application topically 3 (three) times daily.   Yes [provider]  neomycin-bacitracin-polymyxin (NEOSPORIN) ointment Apply 1 application topically every 12 (twelve) hours as needed (minor skin tears).   Yes [provider]  NON FORMULARY Take 4 oz by mouth 2 (two) times daily.   Yes [provider]  NON FORMULARY Take 1 capsule by mouth every morning. Raw One   Yes [provider]  OVER THE COUNTER MEDICATION Take 1 capsule by mouth daily. Garden of Life Primal Defense   Yes [provider]  OVER THE COUNTER MEDICATION Take 1 capsule by mouth daily. [employer] Life RM-10   Yes [provider]  oxyCODONE (OXY IR/ROXICODONE) 5 MG immediate release tablet Take 2.5 mg by mouth every 6 (six) hours as needed (pain).   Yes [provider]  polyethylene glycol (MIRALAX / GLYCOLAX) packet Take 17 g by mouth daily.   Yes [provider]  Sodium Fluoride (PREVIDENT 5000 BOOSTER) 1.1 % PSTE Place 1 application onto teeth 2 (two) times daily.   Yes [provider]  tamsulosin (FLOMAX) 0.4 MG CAPS capsule Take 0.4 mg by mouth 2 (two) times daily.   Yes [provider]   Not on File Review of Systems  Unable to perform ROS: Dementia    Physical Exam  Constitutional: He appears well-developed. He appears ill.  - frail and confused to place and time  Cardiovascular: Normal rate, regular rhythm and normal heart sounds.  Pulmonary/Chest: He has decreased breath sounds.  Neurological: He is alert.  Skin: Skin is warm and dry.    Vital Signs: BP (!) 161/81 (BP Location: Left Arm)   Pulse 74   Temp 98.3 F (36.8 C) (Oral)   Resp  18   Ht 5\' 11"  (1.803 m)   Wt 70.3 kg (154 lb 15.7 oz)   SpO2 95%   BMI 21.62 kg/m  Pain Assessment: No/denies pain       SpO2: SpO2: 95 % O2 Device:SpO2: 95 % O2 Flow Rate: .   IO: Intake/output summary:  Intake/Output Summary (Last 24 hours) at 04/02/2017 3005 Last data filed at 04/02/2017 0601 Gross per 24 hour  Intake 3220 ml  Output 2025 ml  Net 1195 ml    LBM: Last BM Date: 03/29/17 Baseline Weight: Weight: 70.3 kg (155 lb) Most recent weight: Weight: 70.3 kg (154 lb 15.7 oz)     Palliative Assessment/Data:  30 % at best   Discussed with Dr. Grandville Silos  Time In: 1000 Time Out: 1115 Time Total: 75 minutes Greater than 50%  of this time was spent counseling and coordinating care related to the above assessment and plan.  Signed by: Paul Lessen, NP   Please contact Palliative Medicine Team phone at [phone] for questions and concerns.  For individual provider: See Shea Evans

## 2017-04-02 NOTE — Progress Notes (Signed)
PROGRESS NOTE    Paul Costa  YTK:[id number] DOB: Jun 12, 1925 DOA: 03/30/2017 PCP: Patient, No Pcp Per    Brief Narrative:  Patient is a 83 year old gentleman history of diverticulosis with hemorrhage, chronic blood loss, mild dementia, urinary retention with chronic indwelling Foley catheter, hypertension, GERD, essential tremors, bipolar disorder on lithium presents from [facility] due to worsening confusion with some associated shaking and twitching.  Patient noted to have a hemoglobin of 7.2 and a creatinine of 2.2.  Urine cultures pending.  FOBT was positive.  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  Biopsies pending.   Assessment & Plan:   Principal Problem:   Acute on chronic blood loss anemia (HCC) Active Problems:   Acute GI bleeding   CKD (chronic kidney disease), stage III (HCC)   Urinary retention   Abnormal urinalysis   Mild dementia   Acute metabolic encephalopathy   Hematemesis   Esophageal mass   Leukocytosis  #1 acute on chronic blood loss anemia Likely secondary to esophageal mass noted on upper endoscopy and duodenal erosions.  Patient status post 1 unit packed red blood cells.  Hemoglobin at 8.9 on 04/01/2017 from 9.1 from 6.9. Labs pending. Patient status post upper endoscopy with esophageal mass which was partially obstructing noted. Biopsies pending. Transfusion threshold hemoglobin less than 7.  Follow H&H.  2.  Partially obstructing esophageal tumor Per upper endoscopy 03/31/2017.  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  Biopsies taken.  Patient with advanced age, poor condition.  Per GI likely conservative approach is warranted at this time.  Continue current pured diet. Biopsies pending. Palliative care consultation pending.    3.  Acute metabolic encephalopathy Questionable etiology.  May be secondary to problems number 1, 2. Clinically improved. Less  agitated. Less confused. CT head unremarkable.  MRI head done 03/31/2017 of poor quality due to motion.  Urine cultures negative to date.  Chest x-ray done negative for any acute infiltrate.  Patient was empirically on IV Rocephin.  Patient currently afebrile.  As no source of infection noted IV antibiotics were discontinued on 04/01/2017. Ativan as needed.  4.  Mild dementia Ativan as needed.  Will also add Haldol as needed for severe agitation.  5.  Chronic kidney disease stage III Stable.  6.  Urinary retention Continue chronic indwelling Foley catheter.  Outpatient follow-up with urology on discharge.  7.  Mild dementia Outpatient follow-up.  Patient's mentation has improved since admission. Patient noted on admission to be significantly confused.  Infectious workup underway and negative to date.  Patient has been on IV Rocephin since admission and as such we will discontinue for now.  Follow.  8.  Leukocytosis Likely reactive leukocytosis.  Urine cultures negative with no growth to date.  Chest x-ray negative.  Discontinued IV antibiotics and follow.  9.  Hypernatremia Likely secondary to dehydration/volume depletion. Hyponatremia improved. Change IV fluids to half-normal saline. Follow.     DVT prophylaxis: SCDs Code Status: DNR Family Communication:No family at bedside Disposition Plan: To be determined.   Consultants:   Gastroenterology: Dr Loletha Carrow III 03/31/2017  Palliative care pending  Procedures:   CT head 03/31/2017  Upper endoscopy 03/31/2017 per Dr.Danis III  Chest x-ray 03/31/2017  Transfused 1 unit packed red blood cells 03/30/2017  MRI head 03/31/2017  Antimicrobials:   IV Rocephin 03/30/2017>>>>> 04/01/2017   Subjective: Patient sitting up in bed. Denies any chest pain or shortness of breath. Not agitated. Following  commands. Has mittens on.   Objective: Vitals:   04/02/17 0500 04/02/17 0529 04/02/17 0918 04/02/17 1211  BP:  (!) 161/81 (!)  158/78   Pulse:  74 72   Resp:  18 18   Temp:  98.3 F (36.8 C) 98.6 F (37 C)   TempSrc:  Oral Oral   SpO2:  95% 97% 96%  Weight: 70.3 kg (154 lb 15.7 oz)     Height:        Intake/Output Summary (Last 24 hours) at 04/02/2017 1246 Last data filed at 04/02/2017 0900 Gross per 24 hour  Intake 2421.67 ml  Output 2100 ml  Net 321.67 ml   Filed Weights   03/31/17 1135 04/01/17 2123 04/02/17 0500  Weight: 70.3 kg (155 lb) 70.3 kg (154 lb 15.7 oz) 70.3 kg (154 lb 15.7 oz)    Examination:  General exam: Patient alert. Not agitated.  Respiratory system: Lungs clear to auscultation bilaterally. No wheezes, no crackles, no rhonchi. Respiratory effort normal. Cardiovascular system: Regular rate rhythm no murmurs rubs or gallops. No lower extremity edema. Gastrointestinal system: Abdomen is soft, nontender, nondistended, positive bowel sounds.  No rebound.  No guarding.  Central nervous system: Alert and oriented. No focal neurological deficits. Extremities: Symmetric 5 x 5 power. Skin: No rashes, lesions or ulcers Psychiatry: Judgement and insight appear poor. Mood & affect appropriate.     Data Reviewed: I have personally reviewed following labs and imaging studies  CBC: Recent Labs  Lab 03/30/17 0704 03/30/17 1830 03/31/17 0558 04/01/17 0748 04/02/17 1027  WBC 12.9*  --  13.8* 14.3* 11.9*  NEUTROABS 11.1*  --   --   --  10.0*  HGB 6.9* 8.6* 9.1* 8.9* 8.4*  HCT 23.1* 28.3* 30.7* 30.6* 27.8*  MCV 84.3  --  85.3 85.7 84.0  PLT 405*  --  421* 401* 952   Basic Metabolic Panel: Recent Labs  Lab 03/30/17 0704 03/31/17 0558 04/01/17 0748 04/02/17 1027  NA 140 148* 146* 142  K 4.3 4.1 3.9 3.6  CL 114* 118* 117* 112*  CO2 20* 21* 21* 22  GLUCOSE 111* 109* 143* 133*  BUN 38* 35* 29* 22*  CREATININE 1.83* 1.62* 1.38* 1.18  CALCIUM 8.8* 9.2 8.9 8.5*  MG  --   --  2.3  --    GFR: Estimated Creatinine Clearance: 42.2 mL/min (by C-G formula based on SCr of 1.18  mg/dL). Liver Function Tests: Recent Labs  Lab 03/30/17 0704  AST 27  ALT 16*  ALKPHOS 148*  BILITOT 0.6  PROT 5.9*  ALBUMIN 2.3*   No results for input(s): LIPASE, AMYLASE in the last 168 hours. No results for input(s): AMMONIA in the last 168 hours. Coagulation Profile: No results for input(s): INR, PROTIME in the last 168 hours. Cardiac Enzymes: No results for input(s): CKTOTAL, CKMB, CKMBINDEX, TROPONINI in the last 168 hours. BNP (last 3 results) No results for input(s): PROBNP in the last 8760 hours. HbA1C: No results for input(s): HGBA1C in the last 72 hours. CBG: No results for input(s): GLUCAP in the last 168 hours. Lipid Profile: No results for input(s): CHOL, HDL, LDLCALC, TRIG, CHOLHDL, LDLDIRECT in the last 72 hours. Thyroid Function Tests: No results for input(s): TSH, T4TOTAL, FREET4, T3FREE, THYROIDAB in the last 72 hours. Anemia Panel: No results for input(s): VITAMINB12, FOLATE, FERRITIN, TIBC, IRON, RETICCTPCT in the last 72 hours. Sepsis Labs: No results for input(s): PROCALCITON, LATICACIDVEN in the last 168 hours.  Recent Results (from the past 240 hour(s))  Urine Culture     Status: None   Collection Time: 03/30/17  4:58 PM  Result Value Ref Range Status   Specimen Description URINE, RANDOM  Final   Special Requests NONE  Final   Culture NO GROWTH  Final   Report Status 03/31/2017 FINAL  Final  MRSA PCR Screening     Status: Abnormal   Collection Time: 03/30/17  5:06 PM  Result Value Ref Range Status   MRSA by PCR POSITIVE (A) NEGATIVE Final    Comment:        The GeneXpert MRSA Assay (FDA approved for NASAL specimens only), is one component of a comprehensive MRSA colonization surveillance program. It is not intended to diagnose MRSA infection nor to guide or monitor treatment for MRSA infections. RESULT CALLED TO, READ BACK BY AND VERIFIED WITH: A MINTZ,RN AT 1930 03/30/17 BY L BENFIELD          Radiology Studies: Ct Head Wo  Contrast  Result Date: 03/31/2017 CLINICAL DATA:  Altered mental status. History of bipolar disorder, dementia. EXAM: CT HEAD WITHOUT CONTRAST TECHNIQUE: Contiguous axial images were obtained from the base of the skull through the vertex without intravenous contrast. COMPARISON:  None. FINDINGS: BRAIN: No intraparenchymal hemorrhage, mass effect nor midline shift. The ventricles and sulci are normal for age. Patchy supratentorial white matter hypodensities less than expected for patient's age, though non-specific are most compatible with chronic small vessel ischemic disease. No acute large vascular territory infarcts. No abnormal extra-axial fluid collections. Basal cisterns are patent. VASCULAR: Moderate calcific atherosclerosis of the carotid siphons and to lesser extent vertebral artery's. SKULL: No skull fracture. No significant scalp soft tissue swelling. SINUSES/ORBITS: The mastoid air-cells and included paranasal sinuses are well-aerated.The included ocular globes and orbital contents are non-suspicious. Status post bilateral ocular lens implants. OTHER: None. IMPRESSION: Negative noncontrast CT HEAD for age. Electronically Signed   By: Elon Alas M.D.   On: 03/31/2017 13:52   Mr Jeri Cos FY Contrast  Result Date: 03/31/2017 CLINICAL DATA:  Altered mental status.  History of esophageal tumor. EXAM: MRI HEAD WITHOUT AND WITH CONTRAST TECHNIQUE: Multiplanar, multiecho pulse sequences of the brain and surrounding structures were obtained without and with intravenous contrast. CONTRAST:  82mL MULTIHANCE GADOBENATE DIMEGLUMINE 529 MG/ML IV SOLN COMPARISON:  Head CT 03/31/2017 FINDINGS: The study is degraded by motion, despite efforts to reduce this artifact, including utilization of motion-resistant MR sequences. The findings of the study are interpreted in the context of reduced sensitivity/specificity. The coronal T1-weighted postcontrast sequence was omitted. Brain: The midline structures are  normal. There is no acute infarct or acute hemorrhage. No mass lesion, hydrocephalus, dural abnormality or extra-axial collection. Periventricular white matter hyperintensities suggestive of chronic small vessel disease. No age-advanced or lobar predominant atrophy. No chronic microhemorrhage or superficial siderosis. The postcontrast T1-weighted imaging is particularly degraded by motion. At the lateral aspect of the right orbital apex is a focus of contrast enhancement that measures 10 x 8 mm. No other abnormal intracranial contrast enhancement is identified. Vascular: Major intracranial arterial and venous sinus flow voids are preserved. Skull and upper cervical spine: The visualized skull base, calvarium, upper cervical spine and extracranial soft tissues are normal. Sinuses/Orbits: No fluid levels or advanced mucosal thickening. No mastoid or middle ear effusion. Normal orbits. IMPRESSION: 1. Markedly motion degraded examination, particularly on the postcontrast sequences. 2. Focus of contrast enhancement at the lateral aspect of the right orbital apex is indeterminate and seen only on the motion degraded post-contrast sequence.  The most likely explanation is probably artifact; however, in a patient with a known primary neoplasm, repeating the study when the patient can be adequately sedated or otherwise more cooperative is recommended to exclude metastatic disease. 3. Mild chronic microvascular disease without acute intracranial abnormality. Electronically Signed   By: Ulyses Jarred M.D.   On: 03/31/2017 23:34        Scheduled Meds: . albuterol  2.5 mg Nebulization TID  . bethanechol  50 mg Oral BID  . carvedilol  6.25 mg Oral BID  . Chlorhexidine Gluconate Cloth  6 each Topical Q0600  . dutasteride  0.5 mg Oral QHS  . fluticasone  1 spray Each Nare Daily  . fluticasone furoate-vilanterol  1 puff Inhalation Daily  . lithium carbonate  450 mg Oral QHS  . mupirocin ointment  1 application Nasal  BID  . polyethylene glycol  17 g Oral Daily  . sodium chloride flush  3 mL Intravenous Q12H  . tamsulosin  0.4 mg Oral BID   Continuous Infusions: . dextrose 100 mL/hr at 04/02/17 0616     LOS: 3 days    Time spent: 35 minutes    Irine Seal, MD [facility] Pager [phone]  If 7PM-7AM, please contact night-coverage [email or web address] Password [facility] 04/02/2017, 12:46 PM

## 2017-04-02 NOTE — Progress Notes (Signed)
Pt with persistent cough while attempting to drink thin liquids. Will continue to monitor closely. Dorthey Sawyer, RN

## 2017-04-02 NOTE — Progress Notes (Addendum)
Physical Therapy Treatment Patient Details Name: Paul Costa MRN: [id number] DOB: Apr 19, 1925 Today's Date: 04/02/2017    History of Present Illness 83 y.o.malewith medical history significant ofdiverticulosis with hemorrhage, chronic blood loss anemia, mild dementia, urinary retention chronic indwelling Foley, hypertension, hyperlipidemia, gastroesophageal reflux, essential tremor, depression, prior stroke, bipolar disorder on lithium who presents from nursing home for acute onset confusion.    PT Comments    Pt making minimal progress towards achieving his current functional mobility goals. He continues to require heavy physical assistance of two for bed mobility, transfers and very short distance ambulation. Pt would continue to benefit from skilled physical therapy services at this time while admitted and after d/c to address the below listed limitations in order to improve overall safety and independence with functional mobility.  Of note, pt sounded wheezy and with increased WOB during bed mobility and sitting EOB. Pt was on RA and SPO2 was at 80%. Pt's RN was notified.    Follow Up Recommendations  SNF;Supervision/Assistance - 24 hour     Equipment Recommendations  None recommended by PT    Recommendations for Other Services       Precautions / Restrictions Precautions Precautions: Fall Restrictions Weight Bearing Restrictions: No    Mobility  Bed Mobility Overal bed mobility: Needs Assistance Bed Mobility: Supine to Sit;Sit to Supine     Supine to sit: Mod assist;+2 for physical assistance Sit to supine: Max assist;+2 for physical assistance   General bed mobility comments: increased time and effort, multimodal cueing, assist to elevate trunk and for bilateral LE movement, use of bed pads to position pt's hips at EOB  Transfers Overall transfer level: Needs assistance Equipment used: Rolling walker (2 wheeled) Transfers: Sit to/from Stand Sit to Stand: Max  assist;+2 physical assistance;From elevated surface         General transfer comment: assist to power into standing and for balance; pt with frequent buckling of L knee  Ambulation/Gait Ambulation/Gait assistance: Max assist;+2 physical assistance Ambulation Distance (Feet): 2 Feet Assistive device: Rolling walker (2 wheeled)   Gait velocity: decreased Gait velocity interpretation: <1.8 ft/sec, indicative of risk for recurrent falls General Gait Details: pt able to take 4-5 short steps forwards and backwards, as well as 4-5 short side steps towards his L at EOB. Pt very unsteady with frequent buckling of L knee requiring max A x2 and use of RW   Stairs            Wheelchair Mobility    Modified Rankin (Stroke Patients Only)       Balance Overall balance assessment: Needs assistance Sitting-balance support: Feet supported Sitting balance-Leahy Scale: Poor Sitting balance - Comments: pt with significant posterior lean, fluctuating between requiring max A to min A Postural control: Posterior lean Standing balance support: During functional activity;Bilateral upper extremity supported Standing balance-Leahy Scale: Poor Standing balance comment: required bilateral UE support and assist to maintain static standing                            Cognition Arousal/Alertness: Awake/alert Behavior During Therapy: Restless Overall Cognitive Status: Difficult to assess                                 General Comments: pt seemed confused throughout and very difficult to understand his speech, therefore unable to fully assess cognition      Exercises  General Comments        Pertinent Vitals/Pain Pain Assessment: Faces Faces Pain Scale: No hurt    Home Living                      Prior Function            PT Goals (current goals can now be found in the care plan section) Acute Rehab PT Goals PT Goal Formulation: Patient unable  to participate in goal setting Time For Goal Achievement: 03/19/2017 Potential to Achieve Goals: Fair Progress towards PT goals: Progressing toward goals    Frequency    Min 2X/week      PT Plan Current plan remains appropriate    Co-evaluation              AM-PAC PT "6 Clicks" Daily Activity  Outcome Measure  Difficulty turning over in bed (including adjusting bedclothes, sheets and blankets)?: Unable Difficulty moving from lying on back to sitting on the side of the bed? : Unable Difficulty sitting down on and standing up from a chair with arms (e.g., wheelchair, bedside commode, etc,.)?: Unable Help needed moving to and from a bed to chair (including a wheelchair)?: A Lot Help needed walking in hospital room?: A Lot Help needed climbing 3-5 steps with a railing? : Total 6 Click Score: 8    End of Session Equipment Utilized During Treatment: Gait belt Activity Tolerance: Patient limited by fatigue Patient left: in bed;with call bell/phone within reach;with bed alarm set Nurse Communication: Mobility status PT Visit Diagnosis: Muscle weakness (generalized) (M62.81);Ataxic gait (R26.0);Other symptoms and signs involving the nervous system (R29.898)     Time: 4970-2637 PT Time Calculation (min) (ACUTE ONLY): 27 min  Charges:  $Gait Training: 8-22 mins $Therapeutic Activity: 8-22 mins                    G Codes:       [place], [place], [place] 04/02/2017, 4:40 PM

## 2017-04-03 ENCOUNTER — Inpatient Hospital Stay (HOSPITAL_COMMUNITY): Payer: Medicare Other

## 2017-04-03 DIAGNOSIS — J441 Chronic obstructive pulmonary disease with (acute) exacerbation: Secondary | ICD-10-CM | POA: Clinically undetermined

## 2017-04-03 DIAGNOSIS — C159 Malignant neoplasm of esophagus, unspecified: Principal | ICD-10-CM

## 2017-04-03 LAB — CBC
HCT: 29.5 % — ABNORMAL LOW (ref 39.0–52.0)
Hemoglobin: 8.7 g/dL — ABNORMAL LOW (ref 13.0–17.0)
MCH: 25.1 pg — AB (ref 26.0–34.0)
MCHC: 29.5 g/dL — ABNORMAL LOW (ref 30.0–36.0)
MCV: 85 fL (ref 78.0–100.0)
PLATELETS: 310 10*3/uL (ref 150–400)
RBC: 3.47 MIL/uL — ABNORMAL LOW (ref 4.22–5.81)
RDW: 15.9 % — AB (ref 11.5–15.5)
WBC: 13.7 10*3/uL — ABNORMAL HIGH (ref 4.0–10.5)

## 2017-04-03 LAB — BASIC METABOLIC PANEL
Anion gap: 9 (ref 5–15)
BUN: 29 mg/dL — AB (ref 6–20)
CHLORIDE: 112 mmol/L — AB (ref 101–111)
CO2: 20 mmol/L — AB (ref 22–32)
CREATININE: 1.37 mg/dL — AB (ref 0.61–1.24)
Calcium: 8.4 mg/dL — ABNORMAL LOW (ref 8.9–10.3)
GFR calc Af Amer: 51 mL/min — ABNORMAL LOW (ref >60)
GFR calc non Af Amer: 44 mL/min — ABNORMAL LOW (ref >60)
Glucose, Bld: 118 mg/dL — ABNORMAL HIGH (ref 65–99)
Potassium: 3.8 mmol/L (ref 3.5–5.1)
SODIUM: 141 mmol/L (ref 135–145)

## 2017-04-03 MED ORDER — LITHIUM CARBONATE ER 450 MG PO TBCR
450.0000 mg | EXTENDED_RELEASE_TABLET | Freq: Every day | ORAL | Status: DC
  Administered 2017-04-03: 450 mg via ORAL
  Filled 2017-04-03: qty 1

## 2017-04-03 MED ORDER — LORAZEPAM 2 MG/ML IJ SOLN: 1.0000 mg | INTRAMUSCULAR | Status: DC | PRN

## 2017-04-03 MED ORDER — MORPHINE SULFATE (CONCENTRATE) 10 MG /0.5 ML PO SOLN: 5.0000 mg | ORAL | 0 refills | Status: AC | PRN

## 2017-04-03 MED ORDER — IPRATROPIUM-ALBUTEROL 0.5-2.5 (3) MG/3ML IN SOLN: 3.0000 mL | RESPIRATORY_TRACT | Status: DC | PRN

## 2017-04-03 MED ORDER — LORAZEPAM 2 MG/ML PO CONC: 1.0000 mg | Freq: Three times a day (TID) | ORAL | 0 refills | Status: AC | PRN

## 2017-04-03 MED ORDER — SCOPOLAMINE 1 MG/3DAYS TD PT72: 1.0000 | MEDICATED_PATCH | TRANSDERMAL | 0 refills | Status: AC

## 2017-04-03 MED ORDER — RESOURCE THICKENUP CLEAR PO POWD: 1.0000 | ORAL | Status: AC | PRN

## 2017-04-03 MED ORDER — AMOXICILLIN-POT CLAVULANATE 875-125 MG PO TABS
1.0000 | ORAL_TABLET | Freq: Two times a day (BID) | ORAL | Status: DC
  Administered 2017-04-03: 1 via ORAL
  Filled 2017-04-03: qty 1

## 2017-04-03 MED ORDER — BUDESONIDE 0.5 MG/2ML IN SUSP
0.5000 mg | Freq: Two times a day (BID) | RESPIRATORY_TRACT | Status: DC
  Administered 2017-04-03: 0.5 mg via RESPIRATORY_TRACT
  Filled 2017-04-03: qty 2

## 2017-04-03 MED ORDER — METHYLPREDNISOLONE SODIUM SUCC 125 MG IJ SOLR
60.0000 mg | Freq: Two times a day (BID) | INTRAMUSCULAR | Status: DC
  Administered 2017-04-03: 60 mg via INTRAVENOUS
  Filled 2017-04-03: qty 2

## 2017-04-03 MED ORDER — IPRATROPIUM-ALBUTEROL 0.5-2.5 (3) MG/3ML IN SOLN
3.0000 mL | Freq: Four times a day (QID) | RESPIRATORY_TRACT | Status: DC
  Administered 2017-04-03 (×2): 3 mL via RESPIRATORY_TRACT
  Filled 2017-04-03 (×2): qty 3

## 2017-04-03 MED ORDER — MORPHINE SULFATE (PF) 2 MG/ML IV SOLN
1.0000 mg | INTRAVENOUS | Status: DC | PRN
  Administered 2017-04-03: 1 mg via INTRAVENOUS
  Filled 2017-04-03: qty 1

## 2017-04-03 MED ORDER — BUDESONIDE 0.5 MG/2ML IN SUSP: 0.5000 mg | Freq: Two times a day (BID) | RESPIRATORY_TRACT | Status: DC

## 2017-04-03 NOTE — Progress Notes (Signed)
Patient ID: Paul Costa, male   DOB: 01/21/1926, 83 y.o.   MRN: [id number]  This NP visited patient at the bedside as a follow up to  yesterday's [place], son at  bedside.  Continued conversation regarding diagnosis (discussed biopsy report), prognosis,  GOC,  EOL wishes, disposition and options.     Honoring patient's desire for a natural death son has made decision to shift to a full comfort path.  - No treatment for newly diagnosed cancer - No  further life prolonging measures (diagnostics, labs) - No artificial feeding or hydration now or in the future - No antibiotic use - Symptom management  ( Morphine, Ativan, Haldol )  With shift to comfort, poor po intake, anticipated rapid decline, anticipated heavy symptom burden 2/2 to esophageal tumor,      prognosis is less that 2 weeks.  Family hopeful for residential hospice of [facility] for end-of-life care.  MOST form completed to reflect full comfort.  Questions and concerns addressed      Discussed with Dr Grandville Silos  Time in 1000           Time out    1035  Total time spent on the unit was 35 units   Greater than 50% of the time was spent in counseling and coordination of care  Wadie Lessen NP  Palliative Medicine Team Team Phone # [phone] Pager [phone]

## 2017-04-03 NOTE — Discharge Summary (Addendum)
Physician Discharge Summary  Paul Costa WIO:[id number] DOB: 22-Sep-1925 DOA: 03/30/2017  PCP: Patient, No Pcp Per  Admit date: 03/30/2017 Discharge date: 04/03/2017  Time spent: 60 minutes  Recommendations for Outpatient Follow-up:  1. Patient will be discharged to residential hospice home at [facility].  Follow-up with MD at residential hospice home.   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  Filed Weights   04/01/17 2123 04/02/17 0500 04/02/17 2109  Weight: 70.3 kg (154 lb 15.7 oz) 70.3 kg (154 lb 15.7 oz) 70.6 kg (155 lb 10.3 oz)    History of present illness:  Per Dr. Neysa Bonito is a 83 y.o. male with medical history significant of diverticulosis with hemorrhage, chronic blood loss anemia, mild dementia, urinary retention chronic indwelling Foley, hypertension, hyperlipidemia, gastroesophageal reflux, essential tremor, depression, prior stroke, bipolar disorder on lithium who presented to [facility] in transfer from [place] to [facility] the night prior to admission, with confusion and not acting right altered shaking and twitching.  Evaluation in the emergency department revealed a markedly low hemoglobin of 7.2.  (It was 14.2 on most recent CBC from a year ago).  He had a serum creatinine of 2.2 showing a acute on chronic kidney injury 1 year ago his labs were normal.  Urinalysis was also  abnormal and a Foley catheter was placed with greater than 1 L of urine output.  Digital rectal examination revealed brown stool which was fecal occult blood positive. Patient was given IV fluid and levofloxacin was typed and screened for 1 unit of packed red blood cells and transferred to [facility] facility due to no GI physician available there for the next 3 or 4 days. On evaluation on arrival, the patient was nonverbal did respond to deep stimulation but not able to give any history and does not open his eyes.  All information was obtained from the medical record from [facility], transfer communication and patient's nursing home record    Hospital Course:  1.  Partially obstructing esophageal tumor/adenocarcinoma of the esophagus--- terminal diagnosis Patient had presented with an anemia and seen in consultation by gastroenterology.  Patient subsequently underwent upper endoscopy 03/31/2017.  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  Biopsies consistent with adenocarcinoma of esophagus and gastric tissue. Patient with advanced age, dementia, poor condition.  Per GI likely conservative approach is warranted at this time.  She was placed on a pured diet as tumor was noted to be partially obstructing and nectar thick liquids as patient was noted to be coughing on thin liquids.  Palliative care consultation was obtained for goals of care.  It was noted that patient due to poor oral intake, decline, age, underlying dementia, and anticipated rapid decline, anticipated heavy symptom burden secondary to adenocarcinoma of the esophagus but decision was made to shift to full comfort.  It was noted per palliative care that patient had a prognosis of less than 2  weeks.  Decision was made for full comfort measures.  Patient will be discharged to residential hospice home.  2 acute on chronic blood loss anemia Secondary to esophageal mass/adenocarcinoma of the esophagus noted on upper endoscopy and duodenal erosions.   Patient status post 1 unit packed red blood cells.  Hemoglobin improved to 8.7 from a low of 6.9 on 03/30/2017 after transfusion of a unit of packed red blood cells.  GI was consulted and patient underwent upper endoscopy. Patient status post upper endoscopy with esophageal mass which was partially obstructing noted. Biopsies consistent with adenocarcinoma.    3.  Acute metabolic encephalopathy Questionable etiology.    Likely secondary to problems number 1, 2. Clinically improved.  Patient noted to be confused and significantly agitated when he was initially admitted and had to be placed in mittens, Ativan and Haldol as needed.  CT head unremarkable.   MRI head done 03/31/2017 of poor quality due to motion.  Urine cultures negative to date.  Chest x-ray done negative for any acute infiltrate.  Patient was empirically on IV Rocephin.  Patient remained afebrile.  IV antibiotics were subsequently discontinued as no source of infection was noted on 04/01/2017.  Patient was also hydrated with IV fluids.  Patient's confusion improved by day of discharge as patient was more alert and less combative.    4.  Mild dementia Ativan as needed. Haldol as needed for severe agitation.  5.  Chronic kidney disease stage III Stable.  6.  Urinary retention Patient with chronic indwelling Foley catheter.  Patient maintained on home regimen of Flomax.  Outpatient follow-up.    7.  Mild dementia Outpatient follow-up.  Patient's mentation has improved since admission. Patient noted on admission to be significantly confused.  Infectious workup underway and negative to date.  IV antibiotics were started was subsequently discontinued.  Patient now with full comfort measures and patient was discharged to residential hospice home.     8.  Leukocytosis Likely reactive leukocytosis.  Urine cultures negative with no growth to date.  Chest x-ray negative.  She was initially placed on IV antibiotics which was  subsequently discontinued.   9.  Hypernatremia Likely secondary to dehydration/volume depletion.  Patient was placed on IV fluid with hydration.  Patient noted to have poor oral intake.  Patient now for comfort measures.  Patient will be discharged to residential hospice home.  #10 probable COPD exacerbation/wheezing versus aspiration Patient noted to be wheezing on examination on 04/03/2017 as well as hypoxia and per RT. she was given a dose of IV Solu-Medrol and started on some nebulizer treatments and also morphine for his shortness of breath.  Patient was seen by palliative care for goals of care and decision was made for full comfort measures.  Patient placed on morphine for shortness of breath and comfort.  Patient will be discharged to residential hospice home.  11.  Prognosis Patient with a poor prognosis.  Patient was admitted with confusion, shaking and twitching and noted to be severely anemic with a hemoglobin as low as 7.2 and in acute on chronic kidney failure with a creatinine of 2.2.  Was seen in consultation by gastroenterology and underwent an upper endoscopy where a partially obstructing esophageal mass at the GE junction was noted felt to be malignant in nature and biopsy done was consistent with adenocarcinoma.  Patient noted to be declining both physically and functionally.  Patient also noted to have poor oral intake and have bouts of coughing with thin liquids.  Patient during the hospitalization confusion improved however patient with a poor appetite and patient noted to be wheezing and hypoxic by day of discharge.  Palliative care consultation was obtained.  Was noted that palliative care met with family and patient was noted to have a terminal diagnosis of adenocarcinoma of the esophagus.  Patient was given a prognosis of less than 2 weeks with his poor oral intake, anticipated rapid decline and anticipated heavy symptom burden secondary to esophageal tumor.  Patient was made  full comfort and placed on symptom management with morphine, Ativan and Haldol.  Patient will be discharged to [facility].        Procedures:  CT head 03/31/2017  Upper endoscopy 03/31/2017 per Dr.Danis III  Chest x-ray 03/31/2017  Transfused 1 unit packed red blood cells 03/30/2017  MRI head 03/31/2017      Consultations:  Gastroenterology: Dr Loletha Carrow III 03/31/2017  Palliative care Wadie Lessen, NP 04/02/2017      Discharge Exam: Vitals:   04/03/17 0844 04/03/17 1524  BP: (!) 144/59   Pulse: 67   Resp: 20   Temp: 97.6 F (36.4 C)   SpO2: 98% 99%    General: NAD Cardiovascular: RRR Respiratory: Expiratory wheezing, no crackles  Discharge Instructions   Discharge Instructions    Diet general   Complete by:  As directed    dysphagia 1 diet with nectar thick liquids as needed.   Increase activity slowly   Complete by:  As directed      Allergies as of 04/03/2017   Not on File     Medication List    STOP taking these medications   amoxicillin 500 MG capsule Commonly known as:  AMOXIL   ferrous sulfate 325 (65 FE) MG tablet   gemfibrozil 600 MG tablet Commonly known as:  LOPID   guaifenesin 100 MG/5ML syrup Commonly known as:  ROBITUSSIN   levofloxacin 750 MG tablet Commonly known as:  LEVAQUIN   Melatonin 3 MG Tabs   neomycin-bacitracin-polymyxin ointment Commonly known as:  NEOSPORIN   NON FORMULARY   NON FORMULARY   OVER THE COUNTER MEDICATION   OVER THE COUNTER MEDICATION   RISAMINE 0.44-20.625 % Oint Generic drug:  Menthol-Zinc Oxide     TAKE these medications   acetaminophen 500 MG tablet Commonly known as:  TYLENOL Take 1,000 mg by mouth 3 (three) times daily.   albuterol (2.5 MG/3ML) 0.083% nebulizer solution Commonly known as:  PROVENTIL Take 2.5 mg by nebulization every 6 (six) hours. What changed:  Another medication with the same name was removed. Continue taking this medication,  and follow the directions you see here.   alum & mag hydroxide-simeth 200-200-20 MG/5ML suspension Commonly known as:  MAALOX/MYLANTA Take 30 mLs by mouth every 6 (six) hours as needed for indigestion or heartburn.   aspirin 325 MG tablet Take 325 mg by mouth See admin instructions. As directed by EMS for chest pain after EMS has been dispatched PER HOUSE ORDER   bethanechol 50 MG tablet Commonly known as:  URECHOLINE Take 50 mg by mouth 2 (two) times daily.   BREO ELLIPTA 100-25 MCG/INH Aepb Generic drug:  fluticasone furoate-vilanterol Take 1 puff by mouth daily.   Calcium/Vitamin D 600-400 MG-UNIT Tabs Take 2 tablets by mouth daily.   carvedilol 6.25 MG tablet Commonly known as:  COREG Take 6.25 mg by mouth 2 (two) times daily.   diclofenac sodium 1 % Gel Commonly known as:  VOLTAREN Apply 2 g topically 3 (  three) times daily as needed (right and left knee pain).   dutasteride 0.5 MG capsule Commonly known as:  AVODART Take 0.5 mg by mouth at bedtime.   fluticasone 50 MCG/ACT nasal spray Commonly known as:  FLONASE Place 1 spray into both nostrils daily.   Hydrocortisone-Aloe 0.5 % Crea Apply 1 application topically every 6 (six) hours as needed (itch/rash).   lithium carbonate 450 MG CR tablet Commonly known as:  ESKALITH Take 450 mg by mouth at bedtime.   LORazepam 2 MG/ML concentrated solution Commonly known as:  ATIVAN Take 0.5 mLs (1 mg total) by mouth every 8 (eight) hours as needed for anxiety or sedation.   magnesium hydroxide 400 MG/5ML suspension Commonly known as:  MILK OF MAGNESIA Take 30 mLs by mouth daily as needed for mild constipation.   morphine CONCENTRATE 10 mg / 0.5 ml concentrated solution Take 0.25-0.5 mLs (5-10 mg total) by mouth every 2 (two) hours as needed for severe pain, anxiety or shortness of breath.   oxyCODONE 5 MG immediate release tablet Commonly known as:  Oxy IR/ROXICODONE Take 2.5 mg by mouth every 6 (six) hours as needed  (pain).   polyethylene glycol packet Commonly known as:  MIRALAX / GLYCOLAX Take 17 g by mouth daily.   PREVIDENT 5000 BOOSTER 1.1 % Pste Generic drug:  Sodium Fluoride Place 1 application onto teeth 2 (two) times daily.   RESOURCE THICKENUP CLEAR Powd Take 120 g by mouth as needed (with beverages to prevent aspiration).   scopolamine 1 MG/3DAYS Commonly known as:  TRANSDERM-SCOP Place 1 patch (1.5 mg total) onto the skin every 3 (three) days.   tamsulosin 0.4 MG Caps capsule Commonly known as:  FLOMAX Take 0.4 mg by mouth 2 (two) times daily.      Not on File Follow-up Information    MD AT [facility] Follow up.            The results of significant diagnostics from this hospitalization (including imaging, microbiology, ancillary and laboratory) are listed below for reference.    Significant Diagnostic Studies: Ct Head Wo Contrast  Result Date: 03/31/2017 CLINICAL DATA:  Altered mental status. History of bipolar disorder, dementia. EXAM: CT HEAD WITHOUT CONTRAST TECHNIQUE: Contiguous axial images were obtained from the base of the skull through the vertex without intravenous contrast. COMPARISON:  None. FINDINGS: BRAIN: No intraparenchymal hemorrhage, mass effect nor midline shift. The ventricles and sulci are normal for age. Patchy supratentorial white matter hypodensities less than expected for patient's age, though non-specific are most compatible with chronic small vessel ischemic disease. No acute large vascular territory infarcts. No abnormal extra-axial fluid collections. Basal cisterns are patent. VASCULAR: Moderate calcific atherosclerosis of the carotid siphons and to lesser extent vertebral artery's. SKULL: No skull fracture. No significant scalp soft tissue swelling. SINUSES/ORBITS: The mastoid air-cells and included paranasal sinuses are well-aerated.The included ocular globes and orbital contents are non-suspicious. Status post bilateral ocular lens implants. OTHER:  None. IMPRESSION: Negative noncontrast CT HEAD for age. Electronically Signed   By: Elon Alas M.D.   On: 03/31/2017 13:52   Mr Jeri Cos WP Contrast  Result Date: 03/31/2017 CLINICAL DATA:  Altered mental status.  History of esophageal tumor. EXAM: MRI HEAD WITHOUT AND WITH CONTRAST TECHNIQUE: Multiplanar, multiecho pulse sequences of the brain and surrounding structures were obtained without and with intravenous contrast. CONTRAST:  60m MULTIHANCE GADOBENATE DIMEGLUMINE 529 MG/ML IV SOLN COMPARISON:  Head CT 03/31/2017 FINDINGS: The study is degraded by motion, despite efforts to reduce  this artifact, including utilization of motion-resistant MR sequences. The findings of the study are interpreted in the context of reduced sensitivity/specificity. The coronal T1-weighted postcontrast sequence was omitted. Brain: The midline structures are normal. There is no acute infarct or acute hemorrhage. No mass lesion, hydrocephalus, dural abnormality or extra-axial collection. Periventricular white matter hyperintensities suggestive of chronic small vessel disease. No age-advanced or lobar predominant atrophy. No chronic microhemorrhage or superficial siderosis. The postcontrast T1-weighted imaging is particularly degraded by motion. At the lateral aspect of the right orbital apex is a focus of contrast enhancement that measures 10 x 8 mm. No other abnormal intracranial contrast enhancement is identified. Vascular: Major intracranial arterial and venous sinus flow voids are preserved. Skull and upper cervical spine: The visualized skull base, calvarium, upper cervical spine and extracranial soft tissues are normal. Sinuses/Orbits: No fluid levels or advanced mucosal thickening. No mastoid or middle ear effusion. Normal orbits. IMPRESSION: 1. Markedly motion degraded examination, particularly on the postcontrast sequences. 2. Focus of contrast enhancement at the lateral aspect of the right orbital apex is  indeterminate and seen only on the motion degraded post-contrast sequence. The most likely explanation is probably artifact; however, in a patient with a known primary neoplasm, repeating the study when the patient can be adequately sedated or otherwise more cooperative is recommended to exclude metastatic disease. 3. Mild chronic microvascular disease without acute intracranial abnormality. Electronically Signed   By: Ulyses Jarred M.D.   On: 03/31/2017 23:34   Dg Chest Port 1 View  Result Date: 04/03/2017 CLINICAL DATA:  Hypoxia, esophageal malignancy, failure to thrive, former smoker. EXAM: PORTABLE CHEST 1 VIEW COMPARISON:  Chest x-ray of March 31, 2017 FINDINGS: The lungs are slightly less well inflated today. The right hemidiaphragm is higher than the left. There is increased linear density at the left base. The interstitial markings are slightly more conspicuous overall. Subtle nodularity is noted bilaterally. The cardiac silhouette remains enlarged. The pulmonary vascularity is normal. There is calcification in the wall of the thoracic aorta. There is multilevel degenerative disc disease of the thoracic spine. IMPRESSION: Increased atelectasis at the left lung base. Subtle increased interstitial density in both lungs with nodular densities of varying sizes. Chest CT scanning would be a useful next imaging step. Stable cardiomegaly. Thoracic aortic atherosclerosis. Electronically Signed   By: David  Martinique M.D.   On: 04/03/2017 13:39   Dg Chest Port 1 View  Result Date: 03/31/2017 CLINICAL DATA:  Cough and confusion EXAM: PORTABLE CHEST 1 VIEW COMPARISON:  None. FINDINGS: There is atelectatic change in the left base. There is no edema or consolidation. The heart is mildly enlarged with pulmonary vascularity within normal limits. There is aortic atherosclerosis. No evident adenopathy. There is evidence of old trauma involving the left proximal humerus with erosion of the proximal humeral shaft on  the left. IMPRESSION: Atelectatic change left base. No edema or consolidation. Heart mildly enlarged with pulmonary vascularity within normal limits. There is aortic atherosclerosis. There is an old fracture of the proximal left humerus with chronic appearing erosion proximal left humerus. Aortic Atherosclerosis (ICD10-I70.0). Electronically Signed   By: Lowella Grip III M.D.   On: 03/31/2017 09:22    Microbiology: Recent Results (from the past 240 hour(s))  Urine Culture     Status: None   Collection Time: 03/30/17  4:58 PM  Result Value Ref Range Status   Specimen Description URINE, RANDOM  Final   Special Requests NONE  Final   Culture NO GROWTH  Final  Report Status 03/31/2017 FINAL  Final  MRSA PCR Screening     Status: Abnormal   Collection Time: 03/30/17  5:06 PM  Result Value Ref Range Status   MRSA by PCR POSITIVE (A) NEGATIVE Final    Comment:        The GeneXpert MRSA Assay (FDA approved for NASAL specimens only), is one component of a comprehensive MRSA colonization surveillance program. It is not intended to diagnose MRSA infection nor to guide or monitor treatment for MRSA infections. RESULT CALLED TO, READ BACK BY AND VERIFIED WITH: A MINTZ,RN AT 1930 03/30/17 BY L BENFIELD      Labs: Basic Metabolic Panel: Recent Labs  Lab 03/30/17 0704 03/31/17 0558 04/01/17 0748 04/02/17 1027 04/03/17 0843  NA 140 148* 146* 142 141  K 4.3 4.1 3.9 3.6 3.8  CL 114* 118* 117* 112* 112*  CO2 20* 21* 21* 22 20*  GLUCOSE 111* 109* 143* 133* 118*  BUN 38* 35* 29* 22* 29*  CREATININE 1.83* 1.62* 1.38* 1.18 1.37*  CALCIUM 8.8* 9.2 8.9 8.5* 8.4*  MG  --   --  2.3  --   --    Liver Function Tests: Recent Labs  Lab 03/30/17 0704  AST 27  ALT 16*  ALKPHOS 148*  BILITOT 0.6  PROT 5.9*  ALBUMIN 2.3*   No results for input(s): LIPASE, AMYLASE in the last 168 hours. No results for input(s): AMMONIA in the last 168 hours. CBC: Recent Labs  Lab 03/30/17 0704  03/30/17 1830 03/31/17 0558 04/01/17 0748 04/02/17 1027 04/03/17 0843  WBC 12.9*  --  13.8* 14.3* 11.9* 13.7*  NEUTROABS 11.1*  --   --   --  10.0*  --   HGB 6.9* 8.6* 9.1* 8.9* 8.4* 8.7*  HCT 23.1* 28.3* 30.7* 30.6* 27.8* 29.5*  MCV 84.3  --  85.3 85.7 84.0 85.0  PLT 405*  --  421* 401* 329 310   Cardiac Enzymes: No results for input(s): CKTOTAL, CKMB, CKMBINDEX, TROPONINI in the last 168 hours. BNP: BNP (last 3 results) No results for input(s): BNP in the last 8760 hours.  ProBNP (last 3 results) No results for input(s): PROBNP in the last 8760 hours.  CBG: No results for input(s): GLUCAP in the last 168 hours.     Signed:  Irine Seal MD.  [facility] 04/03/2017, 6:21 PM

## 2017-04-03 NOTE — Progress Notes (Signed)
PROGRESS NOTE    Paul Costa  YTK:[id number] DOB: 06-Apr-1926 DOA: 03/30/2017 PCP: Patient, No Pcp Per    Brief Narrative:  Patient is a 83 year old gentleman history of diverticulosis with hemorrhage, chronic blood loss, mild dementia, urinary retention with chronic indwelling Foley catheter, hypertension, GERD, essential tremors, bipolar disorder on lithium presents from [facility] due to worsening confusion with some associated shaking and twitching.  Patient noted to have a hemoglobin of 7.2 and a creatinine of 2.2.  Urine cultures pending.  FOBT was positive.  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  Biopsies pending.   Assessment & Plan:   Principal Problem:   Acute on chronic blood loss anemia (HCC) Active Problems:   Adenocarcinoma of esophagus (HCC)   Acute GI bleeding   CKD (chronic kidney disease), stage III (HCC)   Urinary retention   Abnormal urinalysis   Mild dementia   Acute metabolic encephalopathy   Hematemesis   Esophageal mass   Leukocytosis   DNR (do not resuscitate)   Palliative care by specialist   Adult failure to thrive syndrome   COPD with acute exacerbation ([place]): Probable  #1 acute on chronic blood loss anemia Likely secondary to esophageal mass noted on upper endoscopy and duodenal erosions.  Patient status post 1 unit packed red blood cells.  Hemoglobin at 8.7 from 8.9 on 04/01/2017 from 9.1 from 6.9. Patient status post upper endoscopy with esophageal mass which was partially obstructing noted. Biopsies consistent with adenocarcinoma. Transfusion threshold hemoglobin less than 7.  Follow H&H.  2.  Partially obstructing esophageal tumor/adenocarcinoma of the esophagus Per upper endoscopy 03/31/2017.  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  Biopsies consistent with adenocarcinoma of esophagus and gastric tissue. Patient with advanced age, poor  condition.  Per GI likely conservative approach is warranted at this time.  Continue current pured diet.  Palliative care consultation has been obtained and palliative care with to meet with family again today.   3.  Acute metabolic encephalopathy Questionable etiology.  May be secondary to problems number 1, 2. Clinically improved. Less agitated. Less confused. CT head unremarkable.  MRI head done 03/31/2017 of poor quality due to motion.  Urine cultures negative to date.  Chest x-ray done negative for any acute infiltrate.  Patient was empirically on IV Rocephin.  Patient currently afebrile.  As no source of infection noted IV antibiotics were discontinued on 04/01/2017. Ativan as needed.  4.  Mild dementia Ativan as needed. Haldol as needed for severe agitation.  5.  Chronic kidney disease stage III Stable.  6.  Urinary retention Patient with chronic indwelling Foley catheter.  Urine output of 1.825 L over the past 24 hours.  Continue Flomax.  Outpatient follow-up with urology on discharge.  7.  Mild dementia Outpatient follow-up.  Patient's mentation has improved since admission. Patient noted on admission to be significantly confused.  Infectious workup underway and negative to date.  IV antibiotics have been discontinued.   8.  Leukocytosis Likely reactive leukocytosis.  Urine cultures negative with no growth to date.  Chest x-ray negative.  Discontinued IV antibiotics and follow.  9.  Hypernatremia Likely secondary to dehydration/volume depletion.  Improved with hydration.  Continue IV fluids.  Follow.   #10 probable COPD exacerbation/wheezing Patient noted to be wheezing on examination and per RT.  Patient also noted to have some hypoxia.  Will place on Solu-Medrol 60 mg IV every 12 hours, scheduled duo nebs.  Add Claritin, Flonase, Pulmicort, Augmentin.  Follow.     DVT prophylaxis: SCDs Code Status: DNR Family Communication:No family at bedside Disposition Plan: To be  determined.   Consultants:   Gastroenterology: Dr Loletha Carrow III 03/31/2017  Palliative care Wadie Lessen, NP 04/02/2017  Procedures:   CT head 03/31/2017  Upper endoscopy 03/31/2017 per Dr.Danis III  Chest x-ray 03/31/2017  Transfused 1 unit packed red blood cells 03/30/2017  MRI head 03/31/2017  Antimicrobials:   IV Rocephin 03/30/2017>>>>> 04/01/2017  Augmentin 04/03/2017   Subjective: Patient alert laying in bed.  Patient following commands.  Patient with some occasional bouts of confusion otherwise mentation has improved.  No agitation.  Has mittens on.    Objective: Vitals:   04/03/17 0610 04/03/17 0842 04/03/17 0844 04/03/17 1524  BP: 119/75  (!) 144/59   Pulse: 80  67   Resp: 20  20   Temp: 98.9 F (37.2 C)  97.6 F (36.4 C)   TempSrc: Oral  Oral   SpO2: 99% 98% 98% 99%  Weight:      Height:        Intake/Output Summary (Last 24 hours) at 04/03/2017 1627 Last data filed at 04/03/2017 1409 Gross per 24 hour  Intake 1145 ml  Output 1925 ml  Net -780 ml   Filed Weights   04/01/17 2123 04/02/17 0500 04/02/17 2109  Weight: 70.3 kg (154 lb 15.7 oz) 70.3 kg (154 lb 15.7 oz) 70.6 kg (155 lb 10.3 oz)    Examination:  General exam: Patient alert. Not agitated.  Following some commands. Respiratory system: Lungs with some expiratory wheezing.  No crackles.  No rhonchi.  Respiratory effort normal. Cardiovascular system: Regular rate rhythm no murmurs rubs or gallops. No lower extremity edema. Gastrointestinal system: Abdomen is nontender, nondistended, soft, positive bowel sounds.  No rebound.  No guarding.  Central nervous system: Alert and oriented. No focal neurological deficits. Extremities: Symmetric 5 x 5 power. Skin: No rashes, lesions or ulcers Psychiatry: Judgement and insight appear poor. Mood & affect appropriate.     Data Reviewed: I have personally reviewed following labs and imaging studies  CBC: Recent Labs  Lab 03/30/17 0704  03/30/17 1830 03/31/17 0558 04/01/17 0748 04/02/17 1027 04/03/17 0843  WBC 12.9*  --  13.8* 14.3* 11.9* 13.7*  NEUTROABS 11.1*  --   --   --  10.0*  --   HGB 6.9* 8.6* 9.1* 8.9* 8.4* 8.7*  HCT 23.1* 28.3* 30.7* 30.6* 27.8* 29.5*  MCV 84.3  --  85.3 85.7 84.0 85.0  PLT 405*  --  421* 401* 329 761   Basic Metabolic Panel: Recent Labs  Lab 03/30/17 0704 03/31/17 0558 04/01/17 0748 04/02/17 1027 04/03/17 0843  NA 140 148* 146* 142 141  K 4.3 4.1 3.9 3.6 3.8  CL 114* 118* 117* 112* 112*  CO2 20* 21* 21* 22 20*  GLUCOSE 111* 109* 143* 133* 118*  BUN 38* 35* 29* 22* 29*  CREATININE 1.83* 1.62* 1.38* 1.18 1.37*  CALCIUM 8.8* 9.2 8.9 8.5* 8.4*  MG  --   --  2.3  --   --    GFR: Estimated Creatinine Clearance: 36.5 mL/min (A) (by C-G formula based on SCr of 1.37 mg/dL (H)). Liver Function Tests: Recent Labs  Lab 03/30/17 0704  AST 27  ALT 16*  ALKPHOS 148*  BILITOT 0.6  PROT 5.9*  ALBUMIN 2.3*   No results for input(s): LIPASE, AMYLASE in the last 168 hours. No results for input(s): AMMONIA in the  last 168 hours. Coagulation Profile: No results for input(s): INR, PROTIME in the last 168 hours. Cardiac Enzymes: No results for input(s): CKTOTAL, CKMB, CKMBINDEX, TROPONINI in the last 168 hours. BNP (last 3 results) No results for input(s): PROBNP in the last 8760 hours. HbA1C: No results for input(s): HGBA1C in the last 72 hours. CBG: No results for input(s): GLUCAP in the last 168 hours. Lipid Profile: No results for input(s): CHOL, HDL, LDLCALC, TRIG, CHOLHDL, LDLDIRECT in the last 72 hours. Thyroid Function Tests: No results for input(s): TSH, T4TOTAL, FREET4, T3FREE, THYROIDAB in the last 72 hours. Anemia Panel: No results for input(s): VITAMINB12, FOLATE, FERRITIN, TIBC, IRON, RETICCTPCT in the last 72 hours. Sepsis Labs: No results for input(s): PROCALCITON, LATICACIDVEN in the last 168 hours.  Recent Results (from the past 240 hour(s))  Urine Culture      Status: None   Collection Time: 03/30/17  4:58 PM  Result Value Ref Range Status   Specimen Description URINE, RANDOM  Final   Special Requests NONE  Final   Culture NO GROWTH  Final   Report Status 03/31/2017 FINAL  Final  MRSA PCR Screening     Status: Abnormal   Collection Time: 03/30/17  5:06 PM  Result Value Ref Range Status   MRSA by PCR POSITIVE (A) NEGATIVE Final    Comment:        The GeneXpert MRSA Assay (FDA approved for NASAL specimens only), is one component of a comprehensive MRSA colonization surveillance program. It is not intended to diagnose MRSA infection nor to guide or monitor treatment for MRSA infections. RESULT CALLED TO, READ BACK BY AND VERIFIED WITH: A MINTZ,RN AT 1930 03/30/17 BY L BENFIELD          Radiology Studies: Dg Chest Port 1 View  Result Date: 04/03/2017 CLINICAL DATA:  Hypoxia, esophageal malignancy, failure to thrive, former smoker. EXAM: PORTABLE CHEST 1 VIEW COMPARISON:  Chest x-ray of March 31, 2017 FINDINGS: The lungs are slightly less well inflated today. The right hemidiaphragm is higher than the left. There is increased linear density at the left base. The interstitial markings are slightly more conspicuous overall. Subtle nodularity is noted bilaterally. The cardiac silhouette remains enlarged. The pulmonary vascularity is normal. There is calcification in the wall of the thoracic aorta. There is multilevel degenerative disc disease of the thoracic spine. IMPRESSION: Increased atelectasis at the left lung base. Subtle increased interstitial density in both lungs with nodular densities of varying sizes. Chest CT scanning would be a useful next imaging step. Stable cardiomegaly. Thoracic aortic atherosclerosis. Electronically Signed   By: David  Martinique M.D.   On: 04/03/2017 13:39        Scheduled Meds: . bethanechol  50 mg Oral BID  . Chlorhexidine Gluconate Cloth  6 each Topical Q0600  . dutasteride  0.5 mg Oral QHS  .  ipratropium-albuterol  3 mL Nebulization Q6H  . lithium carbonate  450 mg Oral QHS  . mupirocin ointment  1 application Nasal BID  . sodium chloride flush  3 mL Intravenous Q12H  . tamsulosin  0.4 mg Oral BID   Continuous Infusions: . sodium chloride 10 mL/hr at 04/03/17 1043     LOS: 4 days    Time spent: 35 minutes    Irine Seal, MD [facility] Pager [phone]  If 7PM-7AM, please contact night-coverage [email or web address] Password [facility] 04/03/2017, 4:27 PM

## 2017-04-03 NOTE — Clinical Social Work Note (Signed)
CSW contacted [facility] and referral made for their hospice facility. Needed clinical information transmitted for facility, reviewed by medical director and patient accepted. Nurse advised and called report to facility, MD advised and d/c summary completed and transmitted to facility. Paul, Costa [phone]) contacted and informed that his dad will discharge this evening by non-emergency transport. CSW signing off as patient discharging to [facility] and no other SW intervention services needed at this time.   Paul Costa, MSW, LCSW Licensed Clinical Social Worker [place] [phone]

## 2017-04-03 NOTE — Progress Notes (Signed)
Report given to Merchandiser, retail at [facility].

## 2017-04-04 NOTE — Progress Notes (Signed)
Patient discharged to [facility] via [facility].Belongings sent with patient.Patient's son,Steve made aware. Orah Sonnen, Wonda Cheng, Therapist, sports

## 2017-04-08 DEATH — deceased

## 2018-04-08 DEATH — deceased

## 2019-06-08 IMAGING — CT CT HEAD W/O CM
4 series · 16 of 47 positions shown, 18 images · non-contrast
Comparison: None.

CLINICAL DATA: Altered mental status. History of bipolar disorder,
dementia.

EXAM:
CT HEAD WITHOUT CONTRAST
TECHNIQUE: Contiguous axial images were obtained from the base of the skull
through the vertex without intravenous contrast.

[Series 3: head bone · axial · 0.46mm/px · z∈[-102,-70]mm · 3 of 79 slices shown]
[im 8/79  bone]
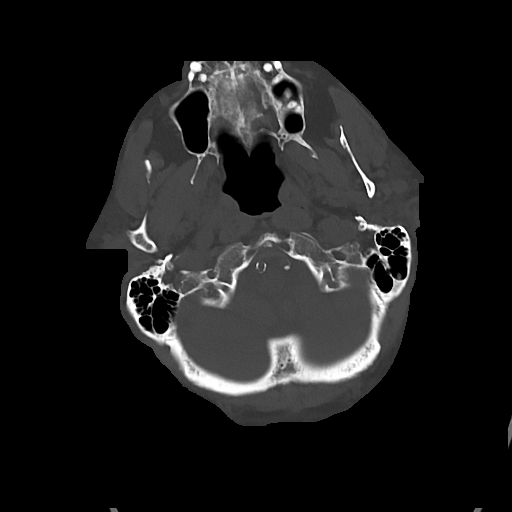
[im 16/79  bone]
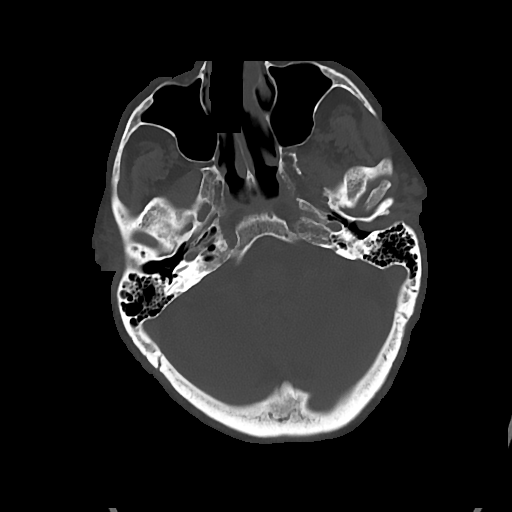
[im 24/79  bone]
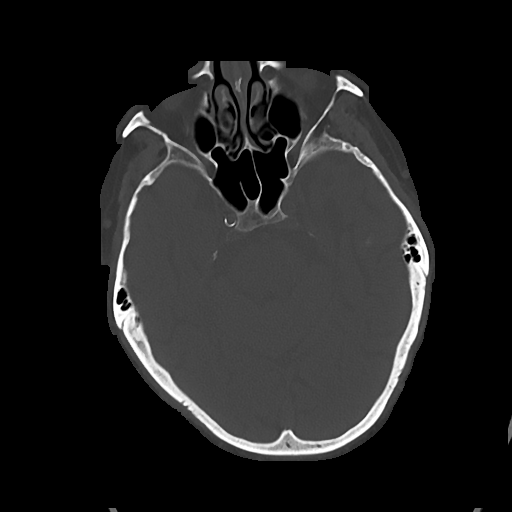

[Series 4: head wo · axial · 0.46mm/px · z∈[-102,+18]mm · 7 of 32 slices shown, 9 images]
[im 4/32  brain]
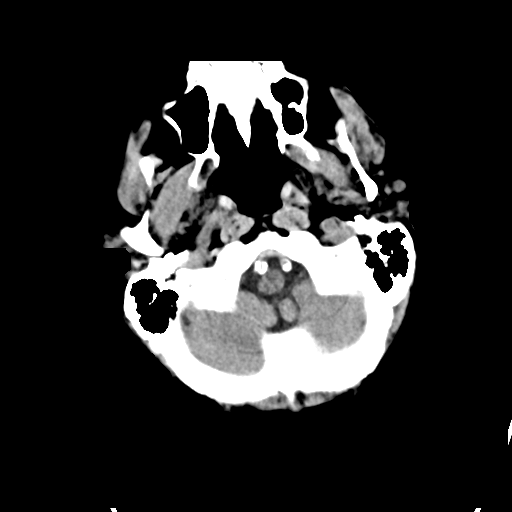
[im 4/32  bone]
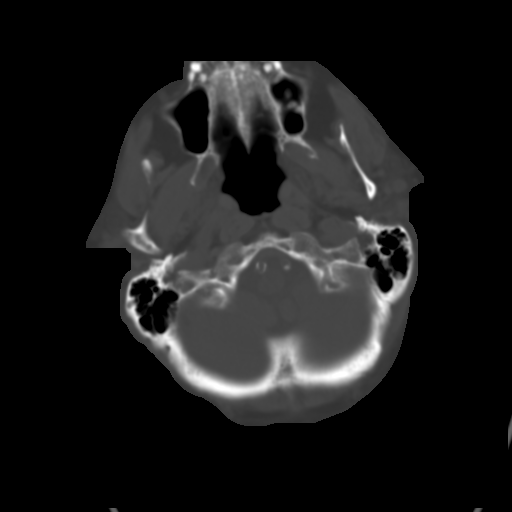
[im 8/32  brain]
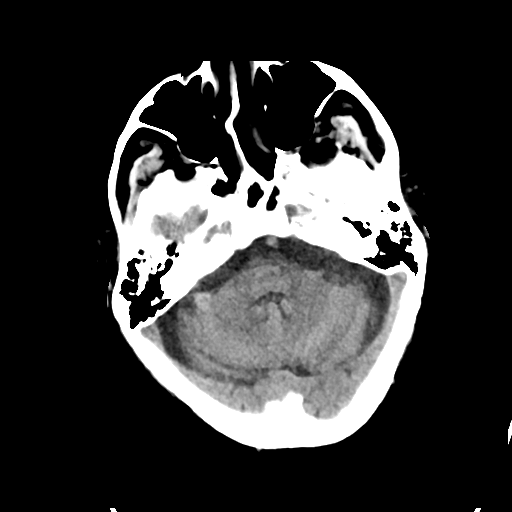
[im 12/32  brain]
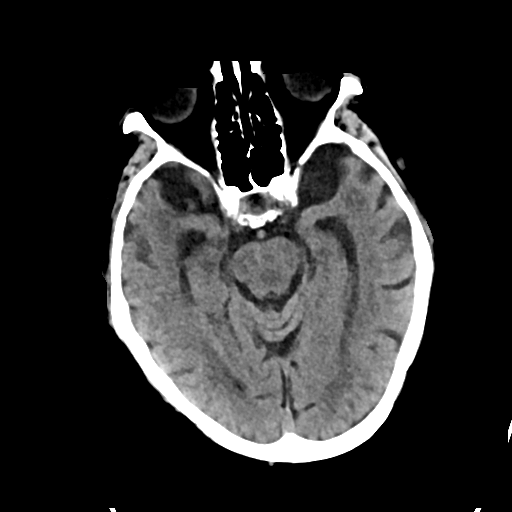
[im 16/32  brain]
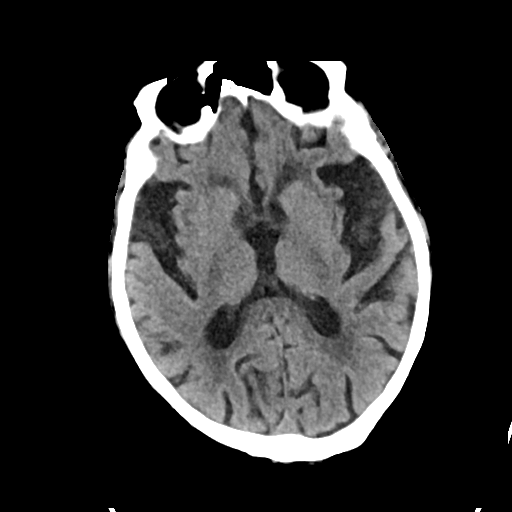
[im 20/32  brain]
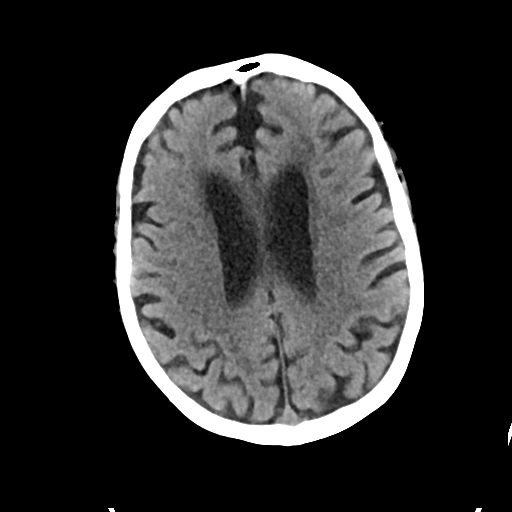
[im 20/32  bone]
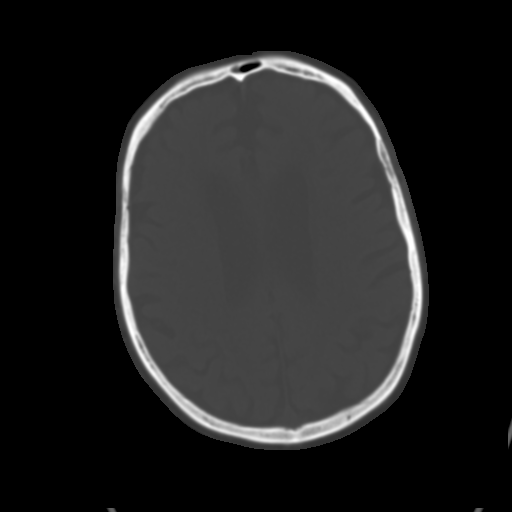
[im 24/32  brain]
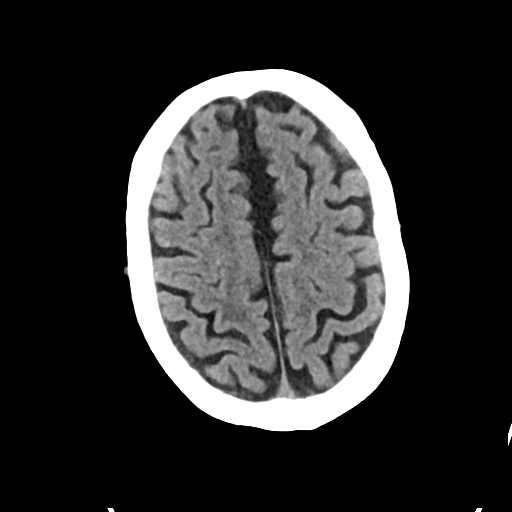
[im 28/32  brain]
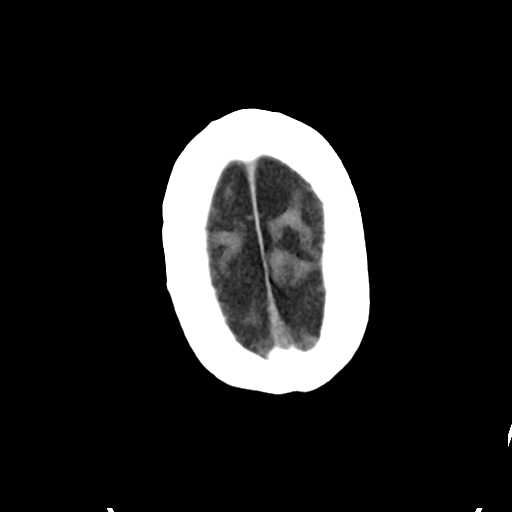

[Series 5: cor soft · coronal · 0.33mm/px · 3 of 66 slices shown]
[im 22/66  brain]
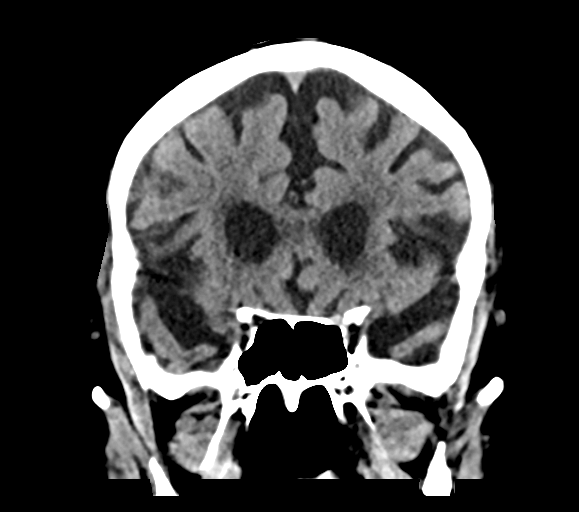
[im 29/66  brain]
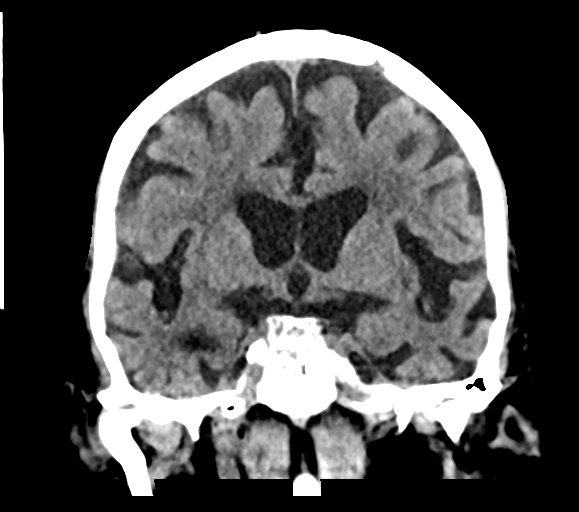
[im 37/66  brain]
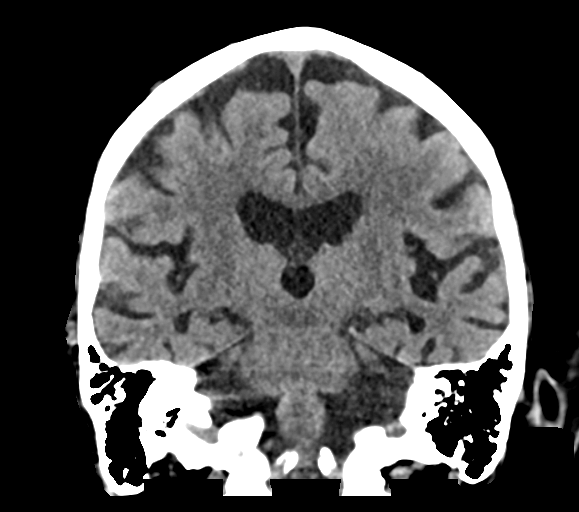

[Series 6: sag soft · sagittal · 0.34mm/px · 3 of 51 slices shown]
[im 17/51  brain]
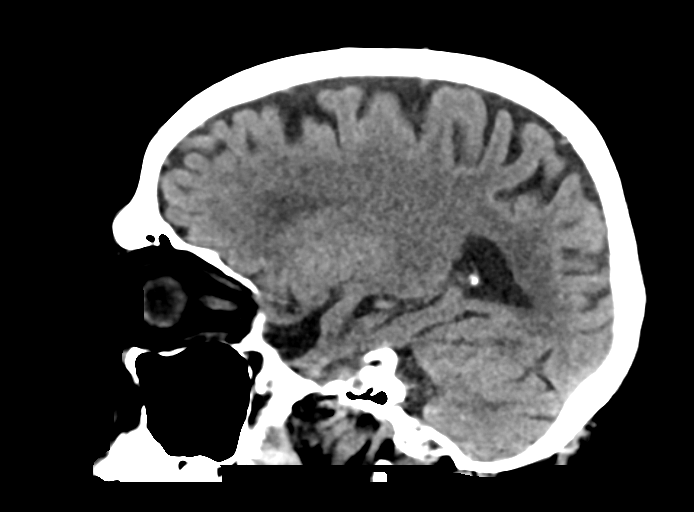
[im 26/51  brain]
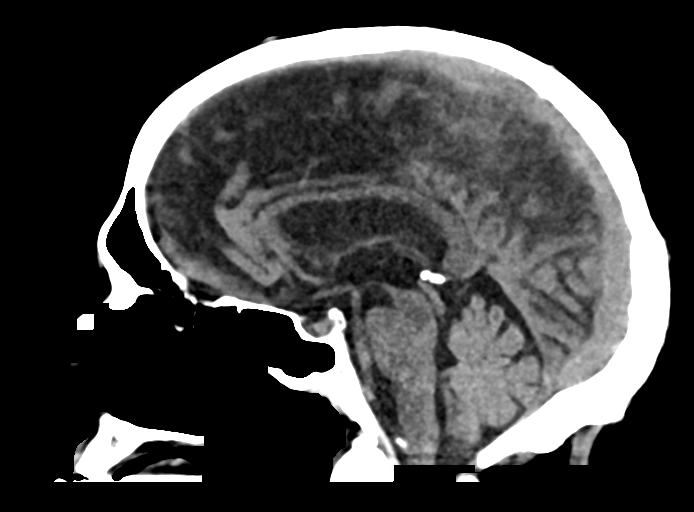
[im 34/51  brain]
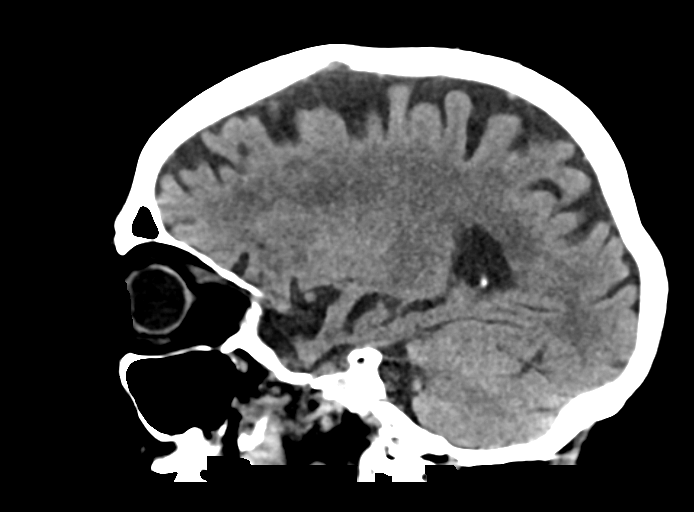

[16 of 47 positions shown; findings below may reference images not displayed]

FINDINGS: BRAIN: No intraparenchymal hemorrhage, mass effect nor midline
shift. The ventricles and sulci are normal for age. Patchy
supratentorial white matter hypodensities less than expected for
patient's age, though non-specific are most compatible with chronic
small vessel ischemic disease. No acute large vascular territory
infarcts. No abnormal extra-axial fluid collections. Basal cisterns
are patent.

VASCULAR: Moderate calcific atherosclerosis of the carotid siphons
and to lesser extent vertebral artery's.

SKULL: No skull fracture. No significant scalp soft tissue swelling.

SINUSES/ORBITS: The mastoid air-cells and included paranasal sinuses
are well-aerated.The included ocular globes and orbital contents are
non-suspicious. Status post bilateral ocular lens implants.

OTHER: None.
IMPRESSION: Negative noncontrast CT HEAD for age.

## 2019-06-11 IMAGING — DX DG CHEST 1V PORT
1 series · 1 of 1 positions shown · non-contrast
Comparison: Chest x-ray of March 31, 2017

CLINICAL DATA: Hypoxia, esophageal malignancy, failure to thrive,
former smoker.

EXAM:
PORTABLE CHEST 1 VIEW

[chest ap]
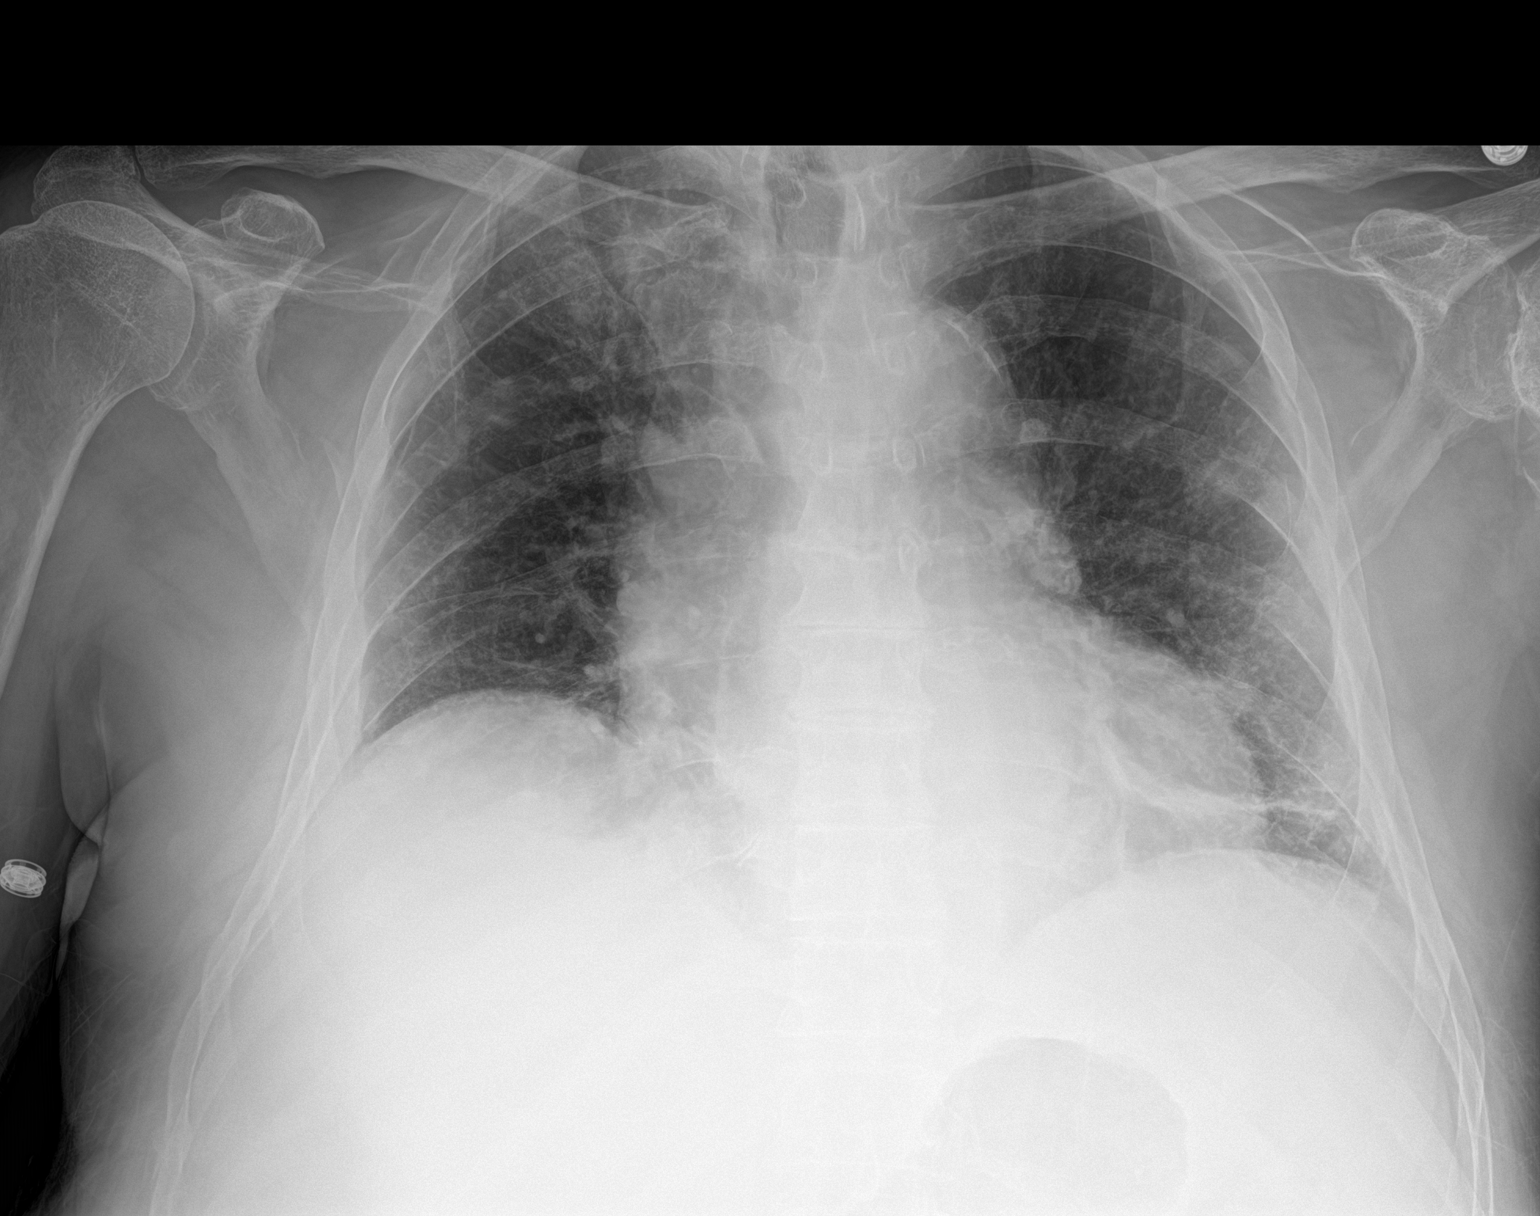

[1 of 1 positions shown; findings below may reference images not displayed]

FINDINGS: The lungs are slightly less well inflated today. The right
hemidiaphragm is higher than the left. There is increased linear
density at the left base. The interstitial markings are slightly
more conspicuous overall. Subtle nodularity is noted bilaterally.
The cardiac silhouette remains enlarged. The pulmonary vascularity
is normal. There is calcification in the wall of the thoracic aorta.
There is multilevel degenerative disc disease of the thoracic spine.
IMPRESSION: Increased atelectasis at the left lung base. Subtle increased
interstitial density in both lungs with nodular densities of varying
sizes. Chest CT scanning would be a useful next imaging step.

Stable cardiomegaly.

Thoracic aortic atherosclerosis.
